# Patient Record
Sex: Female | Born: 1990 | Race: White | Hispanic: No | Marital: Single | Smoking: Never smoker
Health system: Southern US, Community
[De-identification: ages and names within clinical notes are randomized; demographics above are authoritative.]

## PROBLEM LIST (undated history)

## (undated) DIAGNOSIS — Z789 Other specified health status: Secondary | ICD-10-CM

## (undated) DIAGNOSIS — N760 Acute vaginitis: Secondary | ICD-10-CM

## (undated) DIAGNOSIS — B9689 Other specified bacterial agents as the cause of diseases classified elsewhere: Secondary | ICD-10-CM

## (undated) HISTORY — PX: NO PAST SURGERIES: SHX2092

## (undated) HISTORY — DX: Other specified health status: Z78.9

## (undated) HISTORY — PX: WRIST SURGERY: SHX841

## (undated) HISTORY — DX: Other specified bacterial agents as the cause of diseases classified elsewhere: N76.0

## (undated) HISTORY — DX: Acute vaginitis: B96.89

---

## 2013-10-23 ENCOUNTER — Emergency Department: Payer: Self-pay | Admitting: Emergency Medicine

## 2013-10-23 LAB — COMPREHENSIVE METABOLIC PANEL
Albumin: 4.1 g/dL (ref 3.4–5.0)
Alkaline Phosphatase: 80 U/L (ref 50–136)
BUN: 14 mg/dL (ref 7–18)
Bilirubin,Total: 0.7 mg/dL (ref 0.2–1.0)
Chloride: 105 mmol/L (ref 98–107)
Co2: 27 mmol/L (ref 21–32)
EGFR (African American): >60
EGFR (Non-African Amer.): >60
Glucose: 85 mg/dL (ref 65–99)
Osmolality: 270 (ref 275–301)
SGOT(AST): 23 U/L (ref 15–37)
SGPT (ALT): 17 U/L (ref 12–78)
Total Protein: 7.9 g/dL (ref 6.4–8.2)

## 2013-10-23 LAB — URINALYSIS, COMPLETE
Bacteria: NONE SEEN
Blood: NEGATIVE
Leukocyte Esterase: NEGATIVE
Nitrite: NEGATIVE
Ph: 6 (ref 4.5–8.0)
Protein: NEGATIVE
RBC,UR: <1 /HPF (ref 0–5)
Specific Gravity: 1.017 (ref 1.003–1.030)
WBC UR: 1 /HPF (ref 0–5)

## 2013-10-23 LAB — CBC WITH DIFFERENTIAL/PLATELET
Basophil #: 0 10*3/uL (ref 0.0–0.1)
Basophil %: 0.6 %
Eosinophil %: 2.2 %
HCT: 40.6 % (ref 35.0–47.0)
HGB: 14 g/dL (ref 12.0–16.0)
Lymphocyte #: 1.9 10*3/uL (ref 1.0–3.6)
MCH: 30.2 pg (ref 26.0–34.0)
MCHC: 34.4 g/dL (ref 32.0–36.0)
MCV: 88 fL (ref 80–100)
Monocyte #: 0.6 x10 3/mm (ref 0.2–0.9)
Neutrophil %: 60.7 %
Platelet: 257 10*3/uL (ref 150–440)
RBC: 4.64 10*6/uL (ref 3.80–5.20)

## 2013-10-23 LAB — GC/CHLAMYDIA PROBE AMP

## 2013-10-23 LAB — HCG, QUANTITATIVE, PREGNANCY: Beta Hcg, Quant.: 67 m[IU]/mL — ABNORMAL HIGH

## 2013-10-23 LAB — WET PREP, GENITAL

## 2014-05-16 ENCOUNTER — Inpatient Hospital Stay: Payer: Self-pay | Admitting: Obstetrics and Gynecology

## 2014-05-16 LAB — CBC WITH DIFFERENTIAL/PLATELET
Basophil #: 0 10*3/uL (ref 0.0–0.1)
Basophil %: 0.2 %
EOS ABS: 0.1 10*3/uL (ref 0.0–0.7)
Eosinophil %: 0.7 %
HCT: 38.7 % (ref 35.0–47.0)
HGB: 13.1 g/dL (ref 12.0–16.0)
Lymphocyte #: 1.2 10*3/uL (ref 1.0–3.6)
Lymphocyte %: 9.3 %
MCH: 31.8 pg (ref 26.0–34.0)
MCHC: 33.8 g/dL (ref 32.0–36.0)
MCV: 94 fL (ref 80–100)
MONO ABS: 0.9 x10 3/mm (ref 0.2–0.9)
Monocyte %: 6.8 %
NEUTROS ABS: 11.2 10*3/uL — AB (ref 1.4–6.5)
NEUTROS PCT: 83 %
Platelet: 170 10*3/uL (ref 150–440)
RBC: 4.11 10*6/uL (ref 3.80–5.20)
RDW: 13.2 % (ref 11.5–14.5)
WBC: 13.5 10*3/uL — ABNORMAL HIGH (ref 3.6–11.0)

## 2014-05-16 LAB — GC/CHLAMYDIA PROBE AMP

## 2014-05-17 LAB — HEMATOCRIT: HCT: 37.6 % (ref 35.0–47.0)

## 2014-05-19 LAB — BETA STREP CULTURE(ARMC)

## 2015-03-29 NOTE — Op Note (Signed)
PATIENT NAME:  Vanessa Vang, Vanessa Vang MR#:  [id number] DATE OF BIRTH:  Nov 13, 1991  DATE OF PROCEDURE:  05/16/2014  PREOPERATIVE DIAGNOSES:  1.  Gravida 2, P0-0-1-0 at 33 weeks, 5 days gestation.  2.  Preterm labor.  3.  Homero Fellers breech position.   POSTOPERATIVE DIAGNOSES: 1.  Gravida 2, P0-0-1-0 at 33 weeks, 5 days gestation.  2.  Preterm labor.  3.  Homero Fellers breech position.   OPERATION PERFORMED: Low transverse cesarean section with need for a T-shaped extension for delivery of the head secondary to head entrapment.   ANESTHESIA USED: General.   PRIMARY SURGEON: Vena Austria, MD   ASSISTANT: Surgical Tech, [address]   ESTIMATED BLOOD LOSS: 750 mL.  OPERATIVE FLUIDS: 1 L of crystalloid.   PREOPERATIVE ANTIBIOTICS: 1 gram of Ancef.   DRAINS OR TUBES: Foley to gravity drainage and On-Q catheter system.   COMPLICATIONS: Need for an extension of the hysterotomy incision into the contractile portion of the uterus for delivery of the fetal head.  FINDINGS: Normal tubes, ovaries, and uterus. Delivery resulted in the birth of a liveborn female infant weighing 2065 grams, Apgars 8 and 8. The patient initially presented to the office on day of delivery reporting intermittent cramping. She was checked in the office and noted to be 4 cm dilated. On presentation to labor and delivery, the patient was still 4 cm. She received 1 dose of betamethasone and had ampicillin started. Bedside ultrasound revealed the fetus to be in the frank breech position. The patient unfortunately progressed in her labor and was noted to make change to 6 cm and repeat ultrasound confirmed breech presentation, at which time we discussed the risks of vaginal delivery with preterm breech and the decision was made to proceed with C-section for delivery of the fetus.   SPECIMENS REMOVED: None.   THE PATIENT CONDITION FOLLOWING PROCEDURE: Stable.   PROCEDURE IN DETAIL: The patient was taken back to the operating room after  discussing the risks, benefits, and alternatives of the procedure. She was positioned in the supine position after administration of spinal anesthesia, prepped and draped in the usual sterile fashion. A timeout procedure was performed. The level of anesthetic was checked and noted to be adequate prior to proceeding with the case. A Pfannenstiel skin incision was made 2 cm above the pubic symphysis, carried down sharply to the level of the rectus fascia. The fascia was incised in the midline using the scalpel and then extended using Mayo scissors. The superior border of the rectus fascia was grasped with two Kocher clamps. The underlying rectus muscles were dissected off the fascia. The median raphe was incised using Mayo scissors. The inferior border of the rectus fascia was dissected off the muscles in a similar fashion. The midline was identified. The peritoneum was entered bluntly and the peritoneal incision was extended using manual traction. A bladder blade was placed. A low transverse incision was scored on the uterus. The uterus was entered bluntly using the operator's finger. The hysterotomy incision was then extended using manual traction.   Upon placing the operator's hand into the hysterotomy incision, the fetus was noted to be in the frank breech position as has been noted on ultrasound. The buttock was grasped and brought to the incision and the fetus was delivered to the level of the scapula. The left hand was splinted across the chest and delivered atraumatically. The fetus was rotated 180 degrees and the right hand was splinted and delivered in a similar fashion. The fetal  head was attempted to be delivered using a mauriceau smellie veit  maneuver; however, it was noted that the head appeared to be too large for the hysterotomy incision. There was no room to extend the hysterotomy incision laterally and it was deemed that any further attempts at delivering the fetus through the current hysterotomy  incision would place undue traction on the fetal neck. The decision was made to incise the hysterotomy incision with a T-shaped incision superiorly, which did extend into the contractile portion of the myometrium. Following this T-shaped extension, the fetal head was delivered without difficulty. Cord was clamped and cut and the infant was passed to the awaiting pediatricians. Cord blood was obtained. The placenta was delivered using manual extraction. The uterus was exteriorized and wiped clean of clots and debris. The hysterotomy incision was repaired using a 2 layer closure for the low transverse incision with the first being a running locked, the second a vertical imbricating. The T-shaped extension, which was approximately 3 cm, was repaired in 3 layers with the first being a running locked of 0 Vicryl and the second being a horizontal imbricating of 0 Vicryl. The uterine serosa was then closed over the T-shaped incision using a 2-0 Vicryl baseball stitch.   Following closure of the hysterotomy incision, the hysterotomy incision was reinspected and noted to be hemostatic. The uterus was returned to the abdomen. The peritoneal gutters were wiped clean of clots and debris using 2 moist laps. The rectus muscles were reapproximated in the midline using a 2-0 Vicryl mattress stitch. The On-Q catheters were then placed subfascially per the usual protocol. The rectus muscles were reinspected and noted to be hemostatic prior to closing the fascia, which was done using a looped #1 PDS in a running fashion. Subcutaneous tissue was irrigated. Hemostasis was achieved using Bovie. The skin was closed using a 4-0 Monocryl in a subcuticular fashion. Sponge, needle, and instrument counts were correct x 2. The patient's On-Q catheters were bolused with 5 mL of 0.5% bupivacaine each prior to leaving the operating room.  ____________________________ Florina OuAndreas M. Bonney AidStaebler, MD ams:aw D: 05/17/2014 09:03:43 ET T: 05/17/2014  09:24:04 ET JOB#: [id number]  cc: Florina OuAndreas M. Bonney AidStaebler, MD, <Dictator> Carmel SacramentoANDREAS Cathrine MusterM Muhammadali Ries MD ELECTRONICALLY SIGNED 05/28/2014 9:17

## 2015-04-15 NOTE — H&P (Signed)
L&D Evaluation:  History:  HPI 24 yo G2P0010 at 4537w5d by D=7wk US derived EDC of 06/29/2014 presenting for intermitten contractions since yesterday evening, starting after intercourse and increasing throughout day.  Seen in clinic noted to be 5/90/-2.  No LOF, no vaginal bleeding, +FM.   Presents with abdominal pain, preterm cervical dilation   Patient's Medical History No Chronic Illness   Patient's Surgical History none   Medications Pre Natal Vitamins   Allergies NKDA   Social History none   Family History Non-Contributory   ROS:  ROS All systems were reviewed.  HEENT, CNS, GI, GU, Respiratory, CV, Renal and Musculoskeletal systems were found to be normal.   Exam:  Vital Signs stable   Urine Protein not completed   General no apparent distress   Mental Status clear   Chest clear   Heart normal sinus rhythm   Abdomen gravid, non-tender   Estimated Fetal Weight Average for gestational age   Fetal Position Breech on bedside ultrasound   Back no CVAT   Edema no edema   Pelvic no external lesions, 4/C/-2   Mebranes Intact   FHT normal rate with no decels   FHT Description 140, moderate, positive accels, occasional variable   Ucx irregular, 5-3010min   Impression:  Impression Preterm labor 9237w5d   Plan:  Plan EFM/NST, monitor contractions and for cervical change, antibiotics for GBBS prophylaxis   Comments 1) Preterm labor / preterm cervical dilation - discussed with patient if cervical change and given breech position would proceed with LTCS for delivery given risk of head entrapment and untested pelvis.       - BMZ     - ampicillin     - GBS culture     - monitor contraction and cervical change     - NPO      - >32 weeks no magnesium sulfate for CP ppx indicated  2) Fetus - category I tracing  3) PNL O pos / ABSC neg / VZNI / RI / 1-hr 102 / GBS unknown  4) TDAP received 05/13/14   Electronic Signatures: Lorrene ReidStaebler, Katlyn Muldrew M (MD)  (Signed  11-Jun-15 20:21)  Authored: L&D Evaluation   Last Updated: 11-Jun-15 20:21 by Lorrene ReidStaebler, Riyanshi Wahab M (MD)

## 2017-06-13 ENCOUNTER — Emergency Department
Admission: EM | Admit: 2017-06-13 | Discharge: 2017-06-13 | Discharge status: Against Medical Advice | Payer: Managed Care, Other (non HMO) | Attending: Emergency Medicine | Admitting: Emergency Medicine

## 2017-06-13 ENCOUNTER — Encounter: Payer: Self-pay | Admitting: Emergency Medicine

## 2017-06-13 DIAGNOSIS — R1111 Vomiting without nausea: Secondary | ICD-10-CM | POA: Diagnosis not present

## 2017-06-13 DIAGNOSIS — R1030 Lower abdominal pain, unspecified: Secondary | ICD-10-CM | POA: Diagnosis present

## 2017-06-13 DIAGNOSIS — R102 Pelvic and perineal pain: Secondary | ICD-10-CM | POA: Insufficient documentation

## 2017-06-13 LAB — URINALYSIS, COMPLETE (UACMP) WITH MICROSCOPIC
Bacteria, UA: NONE SEEN
Bilirubin Urine: NEGATIVE
Glucose, UA: NEGATIVE mg/dL
Hgb urine dipstick: NEGATIVE
Ketones, ur: NEGATIVE mg/dL
Leukocytes, UA: NEGATIVE
Nitrite: NEGATIVE
Protein, ur: NEGATIVE mg/dL
Specific Gravity, Urine: 1.012 (ref 1.005–1.030)
pH: 6 (ref 5.0–8.0)

## 2017-06-13 LAB — COMPREHENSIVE METABOLIC PANEL
ALBUMIN: 4.4 g/dL (ref 3.5–5.0)
ALT: 13 U/L — AB (ref 14–54)
AST: 22 U/L (ref 15–41)
Alkaline Phosphatase: 58 U/L (ref 38–126)
Anion gap: 8 (ref 5–15)
BUN: 8 mg/dL (ref 6–20)
CHLORIDE: 103 mmol/L (ref 101–111)
CO2: 26 mmol/L (ref 22–32)
CREATININE: 0.84 mg/dL (ref 0.44–1.00)
Calcium: 9.7 mg/dL (ref 8.9–10.3)
GFR calc non Af Amer: >60 mL/min (ref >60)
GLUCOSE: 116 mg/dL — AB (ref 65–99)
Potassium: 3.8 mmol/L (ref 3.5–5.1)
SODIUM: 137 mmol/L (ref 135–145)
Total Bilirubin: 0.9 mg/dL (ref 0.3–1.2)
Total Protein: 7.7 g/dL (ref 6.5–8.1)

## 2017-06-13 LAB — CBC
HCT: 44.4 % (ref 35.0–47.0)
Hemoglobin: 15.4 g/dL (ref 12.0–16.0)
MCH: 31.6 pg (ref 26.0–34.0)
MCHC: 34.8 g/dL (ref 32.0–36.0)
MCV: 91 fL (ref 80.0–100.0)
Platelets: 223 10*3/uL (ref 150–440)
RBC: 4.88 MIL/uL (ref 3.80–5.20)
RDW: 12.7 % (ref 11.5–14.5)
WBC: 7.7 10*3/uL (ref 3.6–11.0)

## 2017-06-13 LAB — POCT PREGNANCY, URINE: PREG TEST UR: NEGATIVE

## 2017-06-13 LAB — LIPASE, BLOOD: Lipase: 29 U/L (ref 11–51)

## 2017-06-13 MED ORDER — NAPROXEN 500 MG PO TABS
500.0000 mg | ORAL_TABLET | Freq: Once | ORAL | Status: AC
  Administered 2017-06-13: 500 mg via ORAL
  Filled 2017-06-13 (×2): qty 1

## 2017-06-13 MED ORDER — NAPROXEN 500 MG PO TABS: 500.0000 mg | ORAL_TABLET | Freq: Two times a day (BID) | ORAL | 0 refills | Status: DC

## 2017-06-13 MED ORDER — KETOROLAC TROMETHAMINE 60 MG/2ML IM SOLN: 15.0000 mg | Freq: Once | INTRAMUSCULAR | Status: DC

## 2017-06-13 NOTE — ED Triage Notes (Signed)
Pt lower abdominal pain starting today.  Denies NVD, no complaints other than pain. No urinary sx.  No vaginal discharge.  ambulatory to triage.  NAD. VSS

## 2017-06-13 NOTE — ED Provider Notes (Signed)
[facility] Emergency Department Provider Note  ____________________________________________  Time seen: Approximately 8:11 PM  I have reviewed the triage vital signs and the nursing notes.   HISTORY  Chief Complaint Abdominal Pain    HPI Vanessa Vang is a 26 y.o. female who complains of lower abdominal pain with vomiting that started this morning, she initially felt it on the right lower quadrant but now that has migrated to the left lower quadrant. The pain is colicky with sharp stabs that lasts for just a second, but then reoccurred about every 3-5 minutes. Nonradiating. No aggravating or alleviating factors. Severe when present. She first went to urgent care who sent her to the ED because she was having severe pain and doubled over. Here she reports that it is improved but still pretty substantial. Last period was about 3 weeks ago, typical timing for her. No history of ovarian cysts.     History reviewed. No pertinent past medical history.   There are no active problems to display for this patient.    History reviewed. No pertinent surgical history.   Prior to Admission medications   Medication Sig Start Date End Date Taking? Authorizing Provider  naproxen (NAPROSYN) 500 MG tablet Take 1 tablet (500 mg total) by mouth 2 (two) times daily with a meal. 06/13/17   Sharman Cheek, MD     Allergies Patient has no known allergies.   History reviewed. No pertinent family history.  Social History Social History  Substance Use Topics  . Smoking status: Never Smoker  . Smokeless tobacco: Never Used  . Alcohol use Yes    Review of Systems  Constitutional:   No fever or chills.  ENT:   No sore throat. No rhinorrhea. Cardiovascular:   No chest pain or syncope. Respiratory:   No dyspnea or cough. Gastrointestinal:   Abdominal pain as above with vomiting. No diarrhea or constipation. No dysuria frequency urgency . No vaginal bleeding or  discharge Musculoskeletal:   Negative for focal pain or swelling All other systems reviewed and are negative except as documented above in ROS and HPI.  ____________________________________________   PHYSICAL EXAM:  VITAL SIGNS: ED Triage Vitals  Enc Vitals Group     BP 06/13/17 1623 126/76     Pulse Rate 06/13/17 1623 88     Resp 06/13/17 1623 16     Temp 06/13/17 1623 98.5 F (36.9 C)     Temp Source 06/13/17 1623 Oral     SpO2 06/13/17 1623 100 %     Weight 06/13/17 1624 144 lb (65.3 kg)     Height 06/13/17 1624 5\' 4"  (1.626 m)     Head Circumference --      Peak Flow --      Pain Score 06/13/17 1623 6     Pain Loc --      Pain Edu? --      Excl. in GC? --     Vital signs reviewed, nursing assessments reviewed.   Constitutional:   Alert and oriented. Well appearing and in no distress. Eyes:   No scleral icterus.  EOMI. No nystagmus. No conjunctival pallor. PERRL. ENT   Head:   Normocephalic and atraumatic.   Nose:   No congestion/rhinnorhea.    Mouth/Throat:   MMM, no pharyngeal erythema. No peritonsillar mass.    Neck:   No meningismus. Full ROM Hematological/Lymphatic/Immunilogical:   No cervical lymphadenopathy. Cardiovascular:   RRR. Symmetric bilateral radial and DP pulses.  No murmurs.  Respiratory:   Normal respiratory effort without tachypnea/retractions. Breath sounds are clear and equal bilaterally. No wheezes/rales/rhonchi. Gastrointestinal:   Soft with focal tenderness in the left lower quadrant. Non distended. There is no CVA tenderness.  No rebound, rigidity, or guarding. Genitourinary:   deferred Musculoskeletal:   Normal range of motion in all extremities. No joint effusions.  No lower extremity tenderness.  No edema. Neurologic:   Normal speech and language.  Motor grossly intact. No gross focal neurologic deficits are appreciated.  Skin:    Skin is warm, dry and intact. No rash noted.  No petechiae, purpura, or  bullae.  ____________________________________________    LABS (pertinent positives/negatives) (all labs ordered are listed, but only abnormal results are displayed) Labs Reviewed  COMPREHENSIVE METABOLIC PANEL - Abnormal; Notable for the following:       Result Value   Glucose, Bld 116 (*)    ALT 13 (*)    All other components within normal limits  URINALYSIS, COMPLETE (UACMP) WITH MICROSCOPIC - Abnormal; Notable for the following:    Color, Urine YELLOW (*)    APPearance CLEAR (*)    Squamous Epithelial / LPF 0-5 (*)    All other components within normal limits  URINE CULTURE  LIPASE, BLOOD  CBC  POC URINE PREG, ED  POCT PREGNANCY, URINE   ____________________________________________   EKG    ____________________________________________    RADIOLOGY  No results found.  ____________________________________________   PROCEDURES Procedures  ____________________________________________   INITIAL IMPRESSION / ASSESSMENT AND PLAN / ED COURSE  Pertinent labs & imaging results that were available during my care of the patient were reviewed by me and considered in my medical decision making (see chart for details).  Patient presents with left lower quadrant pain colicky. Vital signs unremarkable. Not in severe cramping pain, but uncomfortable with localizing tenderness. Based on her symptoms, low suspicion for STI PID TOA. She is not pregnant. Low suspicion for appendicitis perforation or obstruction. No evidence of UTI. I have low suspicion for torsion, but that is the most likely possibility other than something benign by constipation or gas pain, so I recommended a ultrasound of the abdomen and pelvis. Patient was initially agreeable, but now at 8:00 PM reports that she has to leave so that she can become per son by 8:30. I discussed with her the concerns again about torsion and the risks of loss of ovary and decreased fertility or hormonal disruption if this were to  occur. She acknowledges this, has medical decision-making capacity and isn't completely normal mental status, which is has to go due to family obligations. Recommended that if her symptoms do not improve or resolve that she should return to this emergency Department as soon as possible for further assessment. We'll sign her out AGAINST MEDICAL ADVICE at this point      ____________________________________________   FINAL CLINICAL IMPRESSION(S) / ED DIAGNOSES  Final diagnoses:  Pelvic pain in female      New Prescriptions   NAPROXEN (NAPROSYN) 500 MG TABLET    Take 1 tablet (500 mg total) by mouth 2 (two) times daily with a meal.     Portions of this note were generated with dragon dictation software. Dictation errors may occur despite best attempts at proofreading.    Sharman CheekStafford, Obi Scrima, MD 06/13/17 2015

## 2017-06-13 NOTE — ED Notes (Signed)
Pt states lower abd pain and vomiting this AM, states since symptoms have resolved, denies any vaginal discharge or bleeding, awake and alert in no acute distress

## 2017-06-15 LAB — URINE CULTURE

## 2018-06-27 ENCOUNTER — Encounter: Payer: Self-pay | Admitting: Emergency Medicine

## 2018-06-27 ENCOUNTER — Emergency Department: Payer: Self-pay

## 2018-06-27 ENCOUNTER — Emergency Department
Admission: EM | Admit: 2018-06-27 | Discharge: 2018-06-27 | Disposition: A | Discharge status: Home / Self Care | Payer: Self-pay | Attending: Emergency Medicine | Admitting: Emergency Medicine

## 2018-06-27 ENCOUNTER — Other Ambulatory Visit: Payer: Self-pay

## 2018-06-27 DIAGNOSIS — Z3A08 8 weeks gestation of pregnancy: Secondary | ICD-10-CM | POA: Insufficient documentation

## 2018-06-27 DIAGNOSIS — O4690 Antepartum hemorrhage, unspecified, unspecified trimester: Secondary | ICD-10-CM

## 2018-06-27 DIAGNOSIS — O200 Threatened abortion: Secondary | ICD-10-CM | POA: Insufficient documentation

## 2018-06-27 LAB — CBC
HCT: 43 % (ref 35.0–47.0)
HEMOGLOBIN: 15.1 g/dL (ref 12.0–16.0)
MCH: 32.6 pg (ref 26.0–34.0)
MCHC: 35.1 g/dL (ref 32.0–36.0)
MCV: 92.9 fL (ref 80.0–100.0)
Platelets: 224 10*3/uL (ref 150–440)
RBC: 4.62 MIL/uL (ref 3.80–5.20)
RDW: 13.2 % (ref 11.5–14.5)
WBC: 10.1 10*3/uL (ref 3.6–11.0)

## 2018-06-27 LAB — POCT PREGNANCY, URINE: Preg Test, Ur: POSITIVE — AB

## 2018-06-27 LAB — ABO/RH: ABO/RH(D): O POS

## 2018-06-27 LAB — HCG, QUANTITATIVE, PREGNANCY: hCG, Beta Chain, Quant, S: 14431 m[IU]/mL — ABNORMAL HIGH (ref <5)

## 2018-06-27 MED ORDER — PRENATAL VITAMINS 0.8 MG PO TABS: 1.0000 | ORAL_TABLET | Freq: Every day | ORAL | 3 refills | Status: DC

## 2018-06-27 NOTE — Discharge Instructions (Signed)
Your ultrasound shows your pregnancy is in the normal location inside the uterus, estimated gestational age of [redacted] weeks 5 days.  Your pregnancy hormone (beta hCG) level is 14,400 today.  Follow-up with obstetrics in 2 to 3 days for repeat assessment of your symptoms.

## 2018-06-27 NOTE — ED Triage Notes (Signed)
Patient to ER for c/o vaginal bleeding since yesterday. Patient states she is [redacted] weeks pregnant (based on LMP of 05/06/18). Patient states she started having cramping 3 days ago, started spotting yesterday, has heavier bright red bleeding today. This is patient's 3rd pregnancy, has 1 living child, sees [facility] (has not seen this pregnancy yet).

## 2018-06-27 NOTE — ED Provider Notes (Signed)
[facility] Emergency Department Provider Note  ____________________________________________  Time seen: Approximately 8:49 PM  I have reviewed the triage vital signs and the nursing notes.   HISTORY  Chief Complaint Vaginal Bleeding    HPI Vanessa Vang is a 27 y.o. female who complains of cramping pelvic pain that started 3 days ago, gradual onset, mild to moderate intensity, intermittent, waxing waning, no aggravating or alleviating factors, associated with vaginal spotting that started yesterday and gradually worsened to today.  Denies chest pain shortness of breath fevers chills lightheadedness.   Has not noticed any tissue passage.  No prenatal care yet during this pregnancy.     Past Medical History:  Diagnosis Date  . No known health problems      There are no active problems to display for this patient.    Past Surgical History:  Procedure Laterality Date  . CESAREAN SECTION  2015     Prior to Admission medications   Not on File     Allergies Patient has no known allergies.   Family History  Problem Relation Age of Onset  . Other Father        accident    Social History Social History   Tobacco Use  . Smoking status: Never Smoker  . Smokeless tobacco: Never Used  Substance Use Topics  . Alcohol use: Yes    Comment: occ  . Drug use: No    Review of Systems  Constitutional:   No fever or chills.  Cardiovascular:   No chest pain or syncope. Respiratory:   No dyspnea or cough. Gastrointestinal:   Positive pelvic pain as above.  No vomiting diarrhea or constipation.   Musculoskeletal:   Negative for focal pain or swelling All other systems reviewed and are negative except as documented above in ROS and HPI.  ____________________________________________   PHYSICAL EXAM:  VITAL SIGNS: ED Triage Vitals  Enc Vitals Group     BP 06/27/18 1914 132/72     Pulse Rate 06/27/18 1914 73     Resp 06/27/18 1914 20      Temp 06/27/18 1914 98.7 F (37.1 C)     Temp Source 06/27/18 1914 Oral     SpO2 06/27/18 1914 100 %     Weight 06/27/18 1916 148 lb (67.1 kg)     Height 06/27/18 1916 5\' 4"  (1.626 m)     Head Circumference --      Peak Flow --      Pain Score 06/27/18 1916 6     Pain Loc --      Pain Edu? --      Excl. in GC? --     Vital signs reviewed, nursing assessments reviewed.   Constitutional:   Alert and oriented. Non-toxic appearance. Eyes:   Conjunctivae are normal. EOMI. ENT      Head:   Normocephalic and atraumatic.      Nose:   No congestion/rhinnorhea.       Mouth/Throat:   MMM, no pharyngeal erythema. No peritonsillar mass.       Neck:   No meningismus. Full ROM. Hematological/Lymphatic/Immunilogical:   No cervical lymphadenopathy. Cardiovascular:   RRR. Symmetric bilateral radial and DP pulses.  No murmurs. Cap refill less than 2 seconds. Respiratory:   Normal respiratory effort without tachypnea/retractions. Breath sounds are clear and equal bilaterally. No wheezes/rales/rhonchi. Gastrointestinal:   Soft with mild suprapubic tenderness. Non distended. There is no CVA tenderness.  No rebound, rigidity, or guarding. Musculoskeletal:  Normal range of motion in all extremities. No joint effusions.  No lower extremity tenderness.  No edema. Neurologic:   Normal speech and language.  Motor grossly intact. No acute focal neurologic deficits are appreciated.    ____________________________________________    LABS (pertinent positives/negatives) (all labs ordered are listed, but only abnormal results are displayed) Labs Reviewed  HCG, QUANTITATIVE, PREGNANCY - Abnormal; Notable for the following components:      Result Value   hCG, Beta Chain, Quant, S 14,431 (*)    All other components within normal limits  POCT PREGNANCY, URINE - Abnormal; Notable for the following components:   Preg Test, Ur POSITIVE (*)    All other components within normal limits  CBC  POC URINE PREG,  ED  ABO/RH   ____________________________________________   EKG    ____________________________________________    RADIOLOGY  No results found.  ____________________________________________   PROCEDURES Procedures  ____________________________________________  DIFFERENTIAL DIAGNOSIS   Ectopic pregnancy, threatened or completed miscarriage  CLINICAL IMPRESSION / ASSESSMENT AND PLAN / ED COURSE  Pertinent labs & imaging results that were available during my care of the patient were reviewed by me and considered in my medical decision making (see chart for details).    Patient approximately [redacted] weeks pregnant, presents with vaginal bleeding and pelvic cramping.  Possible miscarriage versus ectopic.  Doubt torsion or infectious process.  Ultrasound pelvis ordered.  Clinical Course as of Jul 03 22  Tue Jun 27, 2018  2049 Rh+.  Beta hCG above discriminatory zone .  hCG on the low side for stated LMP of June 1 that would give a gestational age of almost 8 weeks.   [PS]    Clinical Course User Index [PS] Sharman Cheek, MD    ----------------------------------------- 12:22 AM on 07/03/2018 -----------------------------------------  Late entry note, ultrasound showed intrauterine gestational sac.  Counseled on possible ectopic and need for close follow-up within 2 to 3 days for recheck of her beta quant.  Threatened miscarriage.  Discharged home to follow-up with gynecology.  ____________________________________________   FINAL CLINICAL IMPRESSION(S) / ED DIAGNOSES    Final diagnoses:  Vaginal bleeding in pregnancy  Threatened miscarriage     ED Discharge Orders        Ordered    Prenatal Multivit-Min-Fe-FA (PRENATAL VITAMINS) 0.8 MG tablet  Daily,   Status:  Discontinued     06/27/18 2200      Portions of this note were generated with dragon dictation software. Dictation errors may occur despite best attempts at proofreading.    Sharman Cheek,  MD 07/03/18 [phone]

## 2018-06-30 ENCOUNTER — Other Ambulatory Visit
Admission: RE | Admit: 2018-06-30 | Discharge: 2018-06-30 | Disposition: A | Discharge status: Home / Self Care | Payer: Self-pay | Source: Ambulatory Visit | Attending: Obstetrics and Gynecology | Admitting: Obstetrics and Gynecology

## 2018-06-30 ENCOUNTER — Encounter: Payer: Self-pay | Admitting: Obstetrics and Gynecology

## 2018-06-30 ENCOUNTER — Ambulatory Visit (INDEPENDENT_AMBULATORY_CARE_PROVIDER_SITE_OTHER): Payer: Self-pay | Admitting: Obstetrics and Gynecology

## 2018-06-30 VITALS — BP 102/60 | HR 74 | Ht 64.0 in | Wt 150.0 lb

## 2018-06-30 DIAGNOSIS — Z3A00 Weeks of gestation of pregnancy not specified: Secondary | ICD-10-CM | POA: Insufficient documentation

## 2018-06-30 DIAGNOSIS — O200 Threatened abortion: Secondary | ICD-10-CM

## 2018-06-30 LAB — HCG, QUANTITATIVE, PREGNANCY: HCG, BETA CHAIN, QUANT, S: 21031 m[IU]/mL — AB (ref <5)

## 2018-06-30 NOTE — Progress Notes (Signed)
Obstetrics & Gynecology Office Visit   Chief Complaint  Patient presents with  . Vaginal Bleeding    x4 days   History of Present Illness: 27 y.o. [id number] female who presents in follow up from an ER visit on 06/27/18. She presented with vaginal spotting. She had a positive pregnancy test and beta HCG of 14,431.  Her blood type is O+.  Ultrasound showed intrauterine gestational sac without yolk sac or fetal pole.  MSD was 9.7 mm, corresponding an approximate gestational age of 4667w5d.  She states that has bleeding and cramping a lot. She has been passing little blood clots ("size of a bottle cap").  The cramping a week ago. She started bleeding 4 days ago. It started as spotting, then the bleeding became heavier. The bleeding was off and on throughout the day.  She states that the ER could not tell her whether she was having a miscarriage because it was too early on.  Her bleeding has been about the same since she has been in the ER.  The bleeding does become heavier throughout the day.  She has a history of 2 elective abortions and one living child delivered by c-section in 2015.  She states she had an emergency c-section due to bleeding at 7 months.  She delivered at [facility]. According to the operative report, she was 6052w5d and was in preterm labor and the fetus was breech.  Her LMP was 05/06/18.  She states that she has difficulty sleeping.   Past Medical History:  Diagnosis Date  . No known health problems     Past Surgical History:  Procedure Laterality Date  . CESAREAN SECTION  2015    Gynecologic History: Patient's last menstrual period was 05/06/2018 (exact date).  Obstetric History: [id number]  Family History  Problem Relation Age of Onset  . Other Father        accident  Denies family history of gyn cancer  Social History   Socioeconomic History  . Marital status: Single    Spouse name: Not on file  . Number of children: Not on file  . Years of education: Not on file   . Highest education level: Not on file  Occupational History  . Not on file  Social Needs  . Financial resource strain: Not on file  . Food insecurity:    Worry: Not on file    Inability: Not on file  . Transportation needs:    Medical: Not on file    Non-medical: Not on file  Tobacco Use  . Smoking status: Never Smoker  . Smokeless tobacco: Never Used  Substance and Sexual Activity  . Alcohol use: Yes    Comment: occ  . Drug use: No  . Sexual activity: Yes  Lifestyle  . Physical activity:    Days per week: Not on file    Minutes per session: Not on file  . Stress: Not on file  Relationships  . Social connections:    Talks on phone: Not on file    Gets together: Not on file    Attends religious service: Not on file    Active member of club or organization: Not on file    Attends meetings of clubs or organizations: Not on file    Relationship status: Not on file  . Intimate partner violence:    Fear of current or ex partner: Not on file    Emotionally abused: Not on file    Physically abused: Not on file  Forced sexual activity: Not on file  Other Topics Concern  . Not on file  Social History Narrative  . Not on file   Allergies: No Known Allergies  Prior to Admission medications   Denies    Review of Systems  Constitutional: Negative.   HENT: Negative.   Eyes: Negative.   Respiratory: Negative.   Cardiovascular: Negative.   Gastrointestinal: Negative.   Genitourinary: Negative.        See HPI  Musculoskeletal: Negative.   Skin: Negative.   Neurological: Negative.   Psychiatric/Behavioral: Negative.      Physical Exam BP 102/60 (BP Location: Left Arm, Patient Position: Sitting, Cuff Size: Normal)   Pulse 74   Ht 5\' 4"  (1.626 m)   Wt 150 lb (68 kg)   LMP 05/06/2018 (Exact Date)   SpO2 99%   BMI 25.75 kg/m  Patient's last menstrual period was 05/06/2018 (exact date). Physical Exam  Constitutional: She is oriented to person, place, and time.  She appears well-developed and well-nourished. No distress.  Genitourinary: Vagina normal and uterus normal. Pelvic exam was performed with patient supine. There is no rash, tenderness or lesion on the right labia. There is no rash, tenderness or lesion on the left labia. Bleeding: scant old blood in vaginal vault. Right adnexum does not display mass, does not display tenderness and does not display fullness. Left adnexum does not display mass, does not display tenderness and does not display fullness. Cervix does not exhibit motion tenderness, lesion or polyp.   Uterus is mobile. Uterus is not enlarged, tender, exhibiting a mass or irregular (is regular).  Genitourinary Comments: Cervical os is closed. No active bleeding noted.  HENT:  Head: Normocephalic and atraumatic.  Eyes: Conjunctivae are normal. No scleral icterus.  Neck: Normal range of motion. Neck supple. No thyromegaly present.  Cardiovascular: Normal rate and regular rhythm. Exam reveals no gallop and no friction rub.  No murmur heard. Pulmonary/Chest: Effort normal and breath sounds normal. No respiratory distress. She has no wheezes. She has no rales.  Abdominal: Soft. Bowel sounds are normal. She exhibits no distension and no mass. There is tenderness (mild ttp epigastric and suprapubic areas). There is no rebound and no guarding.  Musculoskeletal: Normal range of motion. She exhibits no edema.  Neurological: She is alert and oriented to person, place, and time. No cranial nerve deficit.  Skin: Skin is warm and dry. No erythema.  Psychiatric: She has a normal mood and affect. Her behavior is normal. Judgment normal.   Female chaperone present for pelvic and breast  portions of the physical exam  Lab Results: Lab Results  Component Value Date   HCGBETAQNT 21,031 (H) 06/30/2018   HCGBETAQNT 14,431 (H) 06/27/2018     Assessment: 27 y.o. [id number] female here for  1. Threatened abortion      Plan: Problem List Items  Addressed This Visit    None    Visit Diagnoses    Threatened abortion    -  Primary   Relevant Orders   hCG, quantitative, pregnancy   Beta hCG quant (ref lab)   US OB Comp Less 14 Wks     Discussed threatened miscarriage. Discussed follow up plan of trending hCG levels. Also, discussed repeating ultrasound 2 weeks from prior ultrasound to assess progress, if hCG values are rising appropriately. I discussed potential management strategies for her, should this be a spontaneous abortion, including; expectant management, medication management with misoprostol, and surgical management with D&C.   I called her  once I received her HCG from today. Discussed that the level did not rise appropriately. However, will continue to trend the value until we can be sure.  Order placed for a future hCG. I also ordered a repeat ultrasound 14 days from last ultrasound based on ACOG PB 200 recommendations.  I informed her that I would not be here next week and that she will have to call to get her results and interpretation.  She voiced understanding and agreement.  30 minutes spent in face to face discussion with > 50% spent in counseling,management, and coordination of care of her threatened abortion.   Return for schedule lab draw for 7/30.  Otherwise, schedule pelvic ultrasound for 8/6 for viability.   Thomasene Mohair, MD 06/30/2018 12:49 PM

## 2018-07-06 ENCOUNTER — Other Ambulatory Visit: Payer: Self-pay

## 2018-07-06 DIAGNOSIS — O200 Threatened abortion: Secondary | ICD-10-CM

## 2018-07-07 ENCOUNTER — Telehealth: Payer: Self-pay

## 2018-07-07 LAB — BETA HCG QUANT (REF LAB): hCG Quant: 36228 m[IU]/mL

## 2018-07-07 NOTE — Telephone Encounter (Signed)
Pt called triage today, she had a Beta drawn yesterday and would like to know if she is actually having a miscarriage or not. Sense SDJ is not here will you please advise in his absence.

## 2018-07-07 NOTE — Treatment Plan (Signed)
Discussed beta HCG results with patient; rising value but not at the rate normally seen for pregnancy. Advised that she should come in for an ultrasound and visit; orders in system from last visit with Dr. Jean RosenthalJackson.  Marcelyn BruinsJacelyn Adriann Ballweg, CNM 07/07/2018  11:30 AM

## 2018-07-10 ENCOUNTER — Encounter: Payer: Self-pay | Admitting: Obstetrics and Gynecology

## 2018-07-14 ENCOUNTER — Ambulatory Visit (INDEPENDENT_AMBULATORY_CARE_PROVIDER_SITE_OTHER): Payer: Self-pay | Admitting: Obstetrics and Gynecology

## 2018-07-14 ENCOUNTER — Other Ambulatory Visit (HOSPITAL_COMMUNITY)
Admission: RE | Admit: 2018-07-14 | Discharge: 2018-07-14 | Disposition: A | Discharge status: Home / Self Care | Payer: Self-pay | Source: Ambulatory Visit | Attending: Obstetrics and Gynecology | Admitting: Obstetrics and Gynecology

## 2018-07-14 ENCOUNTER — Encounter: Payer: Self-pay | Admitting: Obstetrics and Gynecology

## 2018-07-14 VITALS — BP 120/80 | Wt 151.0 lb

## 2018-07-14 DIAGNOSIS — N760 Acute vaginitis: Secondary | ICD-10-CM

## 2018-07-14 DIAGNOSIS — N898 Other specified noninflammatory disorders of vagina: Secondary | ICD-10-CM

## 2018-07-14 DIAGNOSIS — B9689 Other specified bacterial agents as the cause of diseases classified elsewhere: Secondary | ICD-10-CM

## 2018-07-14 MED ORDER — METRONIDAZOLE 0.75 % VA GEL: 1.0000 | Freq: Every day | VAGINAL | 1 refills | Status: AC

## 2018-07-14 NOTE — Progress Notes (Signed)
   Patient ID: [name], female   DOB: 21-Jul-1991, 27 y.o.   MRN: [id number]  Reason fMeredeth Ideor Consult: Dysmenorrhea; Vaginal Bleeding; and Threatened Miscarriage (vaginal odor)   [facility]  Subjective:     HPI:  Vanessa Vang is a 27 y.o. female . She presents today for vaginal odor. She is currently being seen for threatened miscarriage by Dr. Jean RosenthalJackson. She has an US scheduled for next week on 07/18/18. She is following up then for viability. She denies any white or yellow vaginal discharge. Denies thin watery vaginal discharge. Reports mild crampy abdominal pain. She has had a small amount of continued vaginal bleeding that she reports is only present when she wipes.    Past Medical History:  Diagnosis Date  . No known health problems    Family History  Problem Relation Age of Onset  . Other Father        accident   Past Surgical History:  Procedure Laterality Date  . CESAREAN SECTION  2015    Short Social History:  Social History   Tobacco Use  . Smoking status: Never Smoker  . Smokeless tobacco: Never Used  Substance Use Topics  . Alcohol use: Yes    Comment: occ    No Known Allergies  No current outpatient medications on file.   No current facility-administered medications for this visit.     Review of Systems  Constitutional: Negative for chills, fatigue, fever and unexpected weight change.  HENT: Negative for trouble swallowing.  Eyes: Negative for loss of vision.  Respiratory: Negative for cough, shortness of breath and wheezing.  Cardiovascular: Negative for chest pain, leg swelling, palpitations and syncope.  GI: Negative for abdominal pain, blood in stool, diarrhea, nausea and vomiting.  GU: Negative for difficulty urinating, dysuria, frequency and hematuria.  Musculoskeletal: Negative for back pain, leg pain and joint pain.  Skin: Negative for rash.  Neurological: Negative for dizziness, headaches, light-headedness, numbness  and seizures.  Psychiatric: Negative for behavioral problem, confusion, depressed mood and sleep disturbance.        Objective:  Objective   Vitals:   07/14/18 1407  BP: 120/80  Weight: 151 lb (68.5 kg)   Body mass index is 25.92 kg/m.  Physical Exam  Constitutional: She is oriented to person, place, and time. She appears well-developed and well-nourished.  HENT:  Head: Normocephalic and atraumatic.  Eyes: Pupils are equal, round, and reactive to light. EOM are normal.  Cardiovascular: Normal rate and regular rhythm.  Pulmonary/Chest: Effort normal. No respiratory distress.  Abdominal: Soft.  Neurological: She is alert and oriented to person, place, and time.  Skin: Skin is warm and dry.  Psychiatric: She has a normal mood and affect. Her behavior is normal. Judgment and thought content normal.  Nursing note and vitals reviewed.  Wet Prep: Clue Cells: Positive Fungal elements: Negative Trichomonas: Negative      Assessment/Plan:     27 yo with vaginal odor 1. Bacterial Vaginosis- will treat with metrogel. Sent aptima to confirm.  2. Follow up ASAP for US confirmation of pregnancy.    Adelene Idlerhristanna Schuman MD [facility], [facility] 07/14/18 2:36 PM

## 2018-07-17 LAB — CERVICOVAGINAL ANCILLARY ONLY
Bacterial vaginitis: NEGATIVE
CANDIDA VAGINITIS: NEGATIVE
CHLAMYDIA, DNA PROBE: NEGATIVE
NEISSERIA GONORRHEA: NEGATIVE
TRICH (WINDOWPATH): NEGATIVE

## 2018-07-21 NOTE — Progress Notes (Signed)
Negative, Released to mychart 

## 2018-07-25 ENCOUNTER — Ambulatory Visit: Payer: Self-pay | Admitting: Obstetrics and Gynecology

## 2018-07-25 ENCOUNTER — Other Ambulatory Visit: Payer: Self-pay

## 2019-09-28 ENCOUNTER — Ambulatory Visit: Payer: Self-pay | Admitting: Obstetrics and Gynecology

## 2019-10-10 NOTE — Progress Notes (Deleted)
   PCP:  [facility]   No chief complaint on file.    HPI:      [name] is a 28 y.o. [phone] who LMP was No LMP recorded. Patient is pregnant., presents today for her annual examination.  Her menses are {norm/abn:715}, lasting {number:22536} days.  Dysmenorrhea {dysmen:716}. She {does:18564} have intermenstrual bleeding.  Sex activity: {sex active:315163}.  Last Pap: September 10, 2015  Results were: no abnormalities  Hx of STDs: none  There is no FH of breast cancer. There is no FH of ovarian cancer. The patient {does:18564} do self-breast exams.  Tobacco use: {tob:20664} Alcohol use: {Alcohol:11675} No drug use.  Exercise: {exercise:31265}  She {does:18564} get adequate calcium and Vitamin D in her diet.   There are no active problems to display for this patient.   Past Surgical History:  Procedure Laterality Date  . CESAREAN SECTION  2015    Family History  Problem Relation Age of Onset  . Other Father        accident    Social History   Socioeconomic History  . Marital status: Single    Spouse name: Not on file  . Number of children: Not on file  . Years of education: Not on file  . Highest education level: Not on file  Occupational History  . Not on file  Social Needs  . Financial resource strain: Not on file  . Food insecurity    Worry: Not on file    Inability: Not on file  . Transportation needs    Medical: Not on file    Non-medical: Not on file  Tobacco Use  . Smoking status: Never Smoker  . Smokeless tobacco: Never Used  Substance and Sexual Activity  . Alcohol use: Yes    Comment: occ  . Drug use: No  . Sexual activity: Yes  Lifestyle  . Physical activity    Days per week: Not on file    Minutes per session: Not on file  . Stress: Not on file  Relationships  . Social Herbalist on phone: Not on file    Gets together: Not on file    Attends religious service: Not on file    Active member of club or  organization: Not on file    Attends meetings of clubs or organizations: Not on file    Relationship status: Not on file  . Intimate partner violence    Fear of current or ex partner: Not on file    Emotionally abused: Not on file    Physically abused: Not on file    Forced sexual activity: Not on file  Other Topics Concern  . Not on file  Social History Narrative  . Not on file    No current outpatient medications on file.     ROS:  Review of Systems BREAST: No symptoms   Objective: There were no vitals taken for this visit.   OBGyn Exam  Results: No results found for this or any previous visit (from the past 24 hour(s)).  Assessment/Plan: No diagnosis found.  No orders of the defined types were placed in this encounter.            GYN counsel {counseling:16159}     F/U  No follow-ups on file.  Kennetha Pearman B. Mykeisha Dysert, PA-C 10/10/2019 4:22 PM

## 2019-10-11 ENCOUNTER — Ambulatory Visit: Payer: Self-pay | Admitting: Obstetrics and Gynecology

## 2019-12-05 ENCOUNTER — Ambulatory Visit (INDEPENDENT_AMBULATORY_CARE_PROVIDER_SITE_OTHER): Payer: BC Managed Care – PPO | Admitting: Obstetrics and Gynecology

## 2019-12-05 ENCOUNTER — Other Ambulatory Visit: Payer: Self-pay

## 2019-12-05 ENCOUNTER — Encounter: Payer: Self-pay | Admitting: Obstetrics and Gynecology

## 2019-12-05 VITALS — BP 110/60 | Ht 64.0 in | Wt 153.0 lb

## 2019-12-05 DIAGNOSIS — N898 Other specified noninflammatory disorders of vagina: Secondary | ICD-10-CM

## 2019-12-05 DIAGNOSIS — Z30011 Encounter for initial prescription of contraceptive pills: Secondary | ICD-10-CM

## 2019-12-05 MED ORDER — MICROGESTIN 24 FE 1-20 MG-MCG PO TABS
1.0000 | ORAL_TABLET | Freq: Every day | ORAL | 1 refills | Status: DC
Start: 2019-12-05 — End: 2020-01-29

## 2019-12-05 NOTE — Progress Notes (Addendum)
[facility], [place]   Chief Complaint  Patient presents with  . Contraception    interested in pills    HPI:      [name] is a 28 y.o. [id number] who LMP was Patient's last menstrual period was 11/30/2019., presents today for [facility] consult. Would like OCPs. Did depo in past with wt gain. Never been on OCPs. No hx of HTN, DVTs, migraines with aura. Menses are monthly, lasting 4-5 day, no BTB unless takes Plan B, no dysmen. She is sex active, using condoms.   Has noticed "dead" smell vaginally before and after menses, lasting several days before resolution, for many months. Nop vaginal itch/irritation. Had neg eval with PCP, including neg STD testing, about 6 months ago. Hx of BV in past. No sx today. Has tried repHresh with some improvement.   Annual past due.  Past Medical History:  Diagnosis Date  . No known health problems      Past Surgical History:  Procedure Laterality Date  . CESAREAN SECTION  2015  . NO PAST SURGERIES      Family History  Problem Relation Age of Onset  . Other Father        accident    Social History   Socioeconomic History  . Marital status: Single    Spouse name: Not on file  . Number of children: Not on file  . Years of education: Not on file  . Highest education level: Not on file  Occupational History  . Not on file  Tobacco Use  . Smoking status: Never Smoker  . Smokeless tobacco: Never Used  Substance and Sexual Activity  . Alcohol use: Yes    Comment: occ  . Drug use: No  . Sexual activity: Yes    Birth control/protection: None  Other Topics Concern  . Not on file  Social History Narrative  . Not on file   Social Determinants of Health   Financial Resource Strain:   . Difficulty of Paying Living Expenses: Not on file  Food Insecurity:   . Worried About Programme researcher, broadcasting/film/video in the Last Year: Not on file  . Ran Out of Food in the Last Year: Not on file  Transportation Needs:   . Lack of Transportation  (Medical): Not on file  . Lack of Transportation (Non-Medical): Not on file  Physical Activity:   . Days of Exercise per Week: Not on file  . Minutes of Exercise per Session: Not on file  Stress:   . Feeling of Stress : Not on file  Social Connections:   . Frequency of Communication with Friends and Family: Not on file  . Frequency of Social Gatherings with Friends and Family: Not on file  . Attends Religious Services: Not on file  . Active Member of Clubs or Organizations: Not on file  . Attends Banker Meetings: Not on file  . Marital Status: Not on file  Intimate Partner Violence:   . Fear of Current or Ex-Partner: Not on file  . Emotionally Abused: Not on file  . Physically Abused: Not on file  . Sexually Abused: Not on file    No outpatient medications prior to visit.   No facility-administered medications prior to visit.      ROS:  Review of Systems  Constitutional: Negative for fever.  Gastrointestinal: Negative for blood in stool, constipation, diarrhea, nausea and vomiting.  Genitourinary: Positive for vaginal discharge. Negative for dyspareunia, dysuria, flank pain,  frequency, hematuria, urgency, vaginal bleeding and vaginal pain.  Musculoskeletal: Negative for back pain.  Skin: Negative for rash.   BREAST: No symptoms   OBJECTIVE:   Vitals:  BP 110/60 (BP Location: Left Arm, Patient Position: Sitting, Cuff Size: Normal)   Ht 5\' 4"  (1.626 m)   Wt 153 lb (69.4 kg)   LMP 11/30/2019   Breastfeeding Unknown   BMI 26.26 kg/m   Physical Exam Vitals reviewed.  Constitutional:      Appearance: She is well-developed.  Pulmonary:     Effort: Pulmonary effort is normal.  Musculoskeletal:        General: Normal range of motion.     Cervical back: Normal range of motion.  Skin:    General: Skin is warm and dry.  Neurological:     General: No focal deficit present.     Mental Status: She is alert and oriented to person, place, and time.      Cranial Nerves: No cranial nerve deficit.  Psychiatric:        Mood and Affect: Mood normal.        Behavior: Behavior normal.        Thought Content: Thought content normal.        Judgment: Judgment normal.     Assessment/Plan: Encounter for initial prescription of contraceptive pills - Plan: Norethindrone Acetate-Ethinyl Estrad-FE (MICROGESTIN 24 FE) 1-20 MG-MCG(24) tablet; OCP start today. Condoms. F/u in 1-2 months at annual.   Vaginal discharge--no sx today. RTO with sx or at annual for wet prep, possible MDL culture. Cont repHresh in meantime, also see if OCPs help sx.    Meds ordered this encounter  Medications  . Norethindrone Acetate-Ethinyl Estrad-FE (MICROGESTIN 24 FE) 1-20 MG-MCG(24) tablet    Sig: Take 1 tablet by mouth daily.    Dispense:  28 tablet    Refill:  1    Order Specific Question:   Supervising Provider    Answer:   Gae Dry [[id number]]      Return in about 4 weeks (around 4/69/6295) for annual.  Hisae Decoursey B. Cashay Manganelli, PA-C 12/05/2019 3:24 PM

## 2019-12-05 NOTE — Patient Instructions (Signed)
I value your feedback and entrusting us with your care. If you get a [facility] patient survey, I would appreciate you taking the time to let us know about your experience today. Thank you!  As of November 15, 2019, your lab results will be released to your MyChart immediately, before I even have a chance to see them. Please give me time to review them and contact you if there are any abnormalities. Thank you for your patience.  

## 2020-01-02 ENCOUNTER — Ambulatory Visit: Payer: BC Managed Care – PPO | Admitting: Obstetrics and Gynecology

## 2020-01-02 NOTE — Progress Notes (Deleted)
PCP:  [place]   No chief complaint on file.    HPI:      [name] is a 29 y.o. [phone] who LMP was No LMP recorded., presents today for her annual examination.  Her menses are {norm/abn:715}, lasting {number:22536} days.  Dysmenorrhea {dysmen:716}. She {does:18564} have intermenstrual bleeding.  DEAD SMELL before and after menses  Sex activity: {sex active:315163}. OCPs started 12/20 Last Pap: {JKKX:[id number]}  Results were: {norm/abn:16707::"no abnormalities"} /neg HPV DNA *** Hx of STDs: {STD hx:14358}  There is no FH of breast cancer. There is no FH of ovarian cancer. The patient {does:18564} do self-breast exams.  Tobacco use: {tob:20664} Alcohol use: {Alcohol:11675} No drug use.  Exercise: {exercise:31265}  She {does:18564} get adequate calcium and Vitamin D in her diet.   There are no problems to display for this patient.   Past Surgical History:  Procedure Laterality Date  . CESAREAN SECTION  2015  . NO PAST SURGERIES      Family History  Problem Relation Age of Onset  . Other Father        accident    Social History   Socioeconomic History  . Marital status: Single    Spouse name: Not on file  . Number of children: Not on file  . Years of education: Not on file  . Highest education level: Not on file  Occupational History  . Not on file  Tobacco Use  . Smoking status: Never Smoker  . Smokeless tobacco: Never Used  Substance and Sexual Activity  . Alcohol use: Yes    Comment: occ  . Drug use: No  . Sexual activity: Yes    Birth control/protection: None  Other Topics Concern  . Not on file  Social History Narrative  . Not on file   Social Determinants of Health   Financial Resource Strain:   . Difficulty of Paying Living Expenses: Not on file  Food Insecurity:   . Worried About Charity fundraiser in the Last Year: Not on file  . Ran Out of Food in the Last Year: Not on file  Transportation Needs:   . Lack  of Transportation (Medical): Not on file  . Lack of Transportation (Non-Medical): Not on file  Physical Activity:   . Days of Exercise per Week: Not on file  . Minutes of Exercise per Session: Not on file  Stress:   . Feeling of Stress : Not on file  Social Connections:   . Frequency of Communication with Friends and Family: Not on file  . Frequency of Social Gatherings with Friends and Family: Not on file  . Attends Religious Services: Not on file  . Active Member of Clubs or Organizations: Not on file  . Attends Archivist Meetings: Not on file  . Marital Status: Not on file  Intimate Partner Violence:   . Fear of Current or Ex-Partner: Not on file  . Emotionally Abused: Not on file  . Physically Abused: Not on file  . Sexually Abused: Not on file     Current Outpatient Medications:  .  Norethindrone Acetate-Ethinyl Estrad-FE (MICROGESTIN 24 FE) 1-20 MG-MCG(24) tablet, Take 1 tablet by mouth daily., Disp: 28 tablet, Rfl: 1     ROS:  Review of Systems BREAST: No symptoms   Objective: There were no vitals taken for this visit.   OBGyn Exam  Results: No results found for this or any previous visit (from the past 24 hour(s)).  Assessment/Plan: No diagnosis found.  No orders of the defined types were placed in this encounter.            GYN counsel {counseling:16159}     F/U  No follow-ups on file.  Panda Crossin B. Demarrius Guerrero, PA-C 01/02/2020 10:46 AM

## 2020-01-22 ENCOUNTER — Other Ambulatory Visit: Payer: Self-pay | Admitting: Obstetrics and Gynecology

## 2020-01-22 DIAGNOSIS — Z30011 Encounter for initial prescription of contraceptive pills: Secondary | ICD-10-CM

## 2020-01-29 ENCOUNTER — Other Ambulatory Visit: Payer: Self-pay | Admitting: Obstetrics and Gynecology

## 2020-01-29 ENCOUNTER — Telehealth: Payer: Self-pay | Admitting: Obstetrics and Gynecology

## 2020-01-29 ENCOUNTER — Other Ambulatory Visit: Payer: Self-pay

## 2020-01-29 DIAGNOSIS — Z30011 Encounter for initial prescription of contraceptive pills: Secondary | ICD-10-CM

## 2020-01-29 MED ORDER — MICROGESTIN 24 FE 1-20 MG-MCG PO TABS: 1.0000 | ORAL_TABLET | Freq: Every day | ORAL | 0 refills | Status: DC

## 2020-01-29 NOTE — Telephone Encounter (Signed)
Patient is schedule for 02/21/20 at 1:30 with ABC

## 2020-01-29 NOTE — Telephone Encounter (Signed)
RF sent.

## 2020-01-29 NOTE — Telephone Encounter (Signed)
Patient needs refill on bc, took her last pill today, scheduled for annual 3/18.  [facility].

## 2020-02-18 ENCOUNTER — Other Ambulatory Visit: Payer: Self-pay | Admitting: Obstetrics and Gynecology

## 2020-02-18 DIAGNOSIS — Z30011 Encounter for initial prescription of contraceptive pills: Secondary | ICD-10-CM

## 2020-02-21 ENCOUNTER — Ambulatory Visit (INDEPENDENT_AMBULATORY_CARE_PROVIDER_SITE_OTHER): Payer: BC Managed Care – PPO | Admitting: Obstetrics and Gynecology

## 2020-02-21 ENCOUNTER — Other Ambulatory Visit: Payer: Self-pay

## 2020-02-21 ENCOUNTER — Encounter: Payer: Self-pay | Admitting: Obstetrics and Gynecology

## 2020-02-21 ENCOUNTER — Other Ambulatory Visit (HOSPITAL_COMMUNITY)
Admission: RE | Admit: 2020-02-21 | Discharge: 2020-02-21 | Disposition: A | Discharge status: Home / Self Care | Payer: Self-pay | Source: Ambulatory Visit | Attending: Obstetrics and Gynecology | Admitting: Obstetrics and Gynecology

## 2020-02-21 VITALS — BP 120/70 | Ht 63.0 in | Wt 155.0 lb

## 2020-02-21 DIAGNOSIS — Z01419 Encounter for gynecological examination (general) (routine) without abnormal findings: Secondary | ICD-10-CM

## 2020-02-21 DIAGNOSIS — Z3041 Encounter for surveillance of contraceptive pills: Secondary | ICD-10-CM

## 2020-02-21 DIAGNOSIS — Z124 Encounter for screening for malignant neoplasm of cervix: Secondary | ICD-10-CM | POA: Insufficient documentation

## 2020-02-21 DIAGNOSIS — B9689 Other specified bacterial agents as the cause of diseases classified elsewhere: Secondary | ICD-10-CM

## 2020-02-21 DIAGNOSIS — Z01411 Encounter for gynecological examination (general) (routine) with abnormal findings: Secondary | ICD-10-CM | POA: Diagnosis not present

## 2020-02-21 DIAGNOSIS — N921 Excessive and frequent menstruation with irregular cycle: Secondary | ICD-10-CM | POA: Diagnosis not present

## 2020-02-21 DIAGNOSIS — N760 Acute vaginitis: Secondary | ICD-10-CM | POA: Diagnosis not present

## 2020-02-21 LAB — POCT WET PREP WITH KOH
Clue Cells Wet Prep HPF POC: POSITIVE
KOH Prep POC: POSITIVE — AB
Trichomonas, UA: NEGATIVE
Yeast Wet Prep HPF POC: NEGATIVE

## 2020-02-21 MED ORDER — METRONIDAZOLE 500 MG PO TABS: 500.0000 mg | ORAL_TABLET | Freq: Two times a day (BID) | ORAL | 0 refills | Status: DC

## 2020-02-21 MED ORDER — BLISOVI 24 FE 1-20 MG-MCG(24) PO TABS: 1.0000 | ORAL_TABLET | Freq: Every day | ORAL | 3 refills | Status: DC

## 2020-02-21 NOTE — Progress Notes (Signed)
PCP:  Patient, No Pcp Per   Chief Complaint  Patient presents with  . Gynecologic Exam     HPI:      [name] is a 29 y.o. [phone] who LMP was Patient's last menstrual period was 01/30/2020 (approximate)., presents today for her annual examination.  Her menses are irregular since starting OCPs 12/20. On 3rd pill pack. Has random bleeding/spotting. Dysmenorrhea none.   Sex activity: single partner, contraception - OCP (estrogen/progesterone).  Last Pap: 09/10/15 Results: no abnormalities; neg STD testing ~6/20 per pt (with PCP) Hx of STDs: chlamydia in distant past  Complains of vaginal odor, particularly around menses. Has been going on for a few months recently. Treats with repHresh OTC with some relief. Has sx today. Not using condoms, not taking probiotics, has not tried boric acid supp. Hx of BV in past and feels like sx are recurrent, triggered by sex and period.  There is no FH of breast cancer. There is no FH of ovarian cancer. The patient does not do self-breast exams.  Tobacco use: The patient denies current or previous tobacco use. Alcohol use: social drinker No drug use.  Exercise: moderately active  She does get adequate calcium and Vitamin D in her diet.   Past Medical History:  Diagnosis Date  . No known health problems     Past Surgical History:  Procedure Laterality Date  . CESAREAN SECTION  2015  . NO PAST SURGERIES      Family History  Problem Relation Age of Onset  . Other Father        accident    Social History   Socioeconomic History  . Marital status: Single    Spouse name: Not on file  . Number of children: Not on file  . Years of education: Not on file  . Highest education level: Not on file  Occupational History  . Not on file  Tobacco Use  . Smoking status: Never Smoker  . Smokeless tobacco: Never Used  Substance and Sexual Activity  . Alcohol use: Yes    Comment: occ  . Drug use: No  . Sexual activity: Yes     Birth control/protection: Pill  Other Topics Concern  . Not on file  Social History Narrative  . Not on file   Social Determinants of Health   Financial Resource Strain:   . Difficulty of Paying Living Expenses:   Food Insecurity:   . Worried About Programme researcher, broadcasting/film/video in the Last Year:   . Barista in the Last Year:   Transportation Needs:   . Freight forwarder (Medical):   Marland Kitchen Lack of Transportation (Non-Medical):   Physical Activity:   . Days of Exercise per Week:   . Minutes of Exercise per Session:   Stress:   . Feeling of Stress :   Social Connections:   . Frequency of Communication with Friends and Family:   . Frequency of Social Gatherings with Friends and Family:   . Attends Religious Services:   . Active Member of Clubs or Organizations:   . Attends Banker Meetings:   Marland Kitchen Marital Status:   Intimate Partner Violence:   . Fear of Current or Ex-Partner:   . Emotionally Abused:   Marland Kitchen Physically Abused:   . Sexually Abused:      Current Outpatient Medications:  .  Norethindrone Acetate-Ethinyl Estrad-FE (BLISOVI 24 FE) 1-20 MG-MCG(24) tablet, Take 1 tablet by mouth daily., Disp: 84 tablet,  Rfl: 3 .  metroNIDAZOLE (FLAGYL) 500 MG tablet, Take 1 tablet (500 mg total) by mouth 2 (two) times daily for 7 days., Disp: 14 tablet, Rfl: 0     ROS:  Review of Systems  Constitutional: Negative for fatigue, fever and unexpected weight change.  Respiratory: Negative for cough, shortness of breath and wheezing.   Cardiovascular: Negative for chest pain, palpitations and leg swelling.  Gastrointestinal: Negative for blood in stool, constipation, diarrhea, nausea and vomiting.  Endocrine: Negative for cold intolerance, heat intolerance and polyuria.  Genitourinary: Positive for vaginal discharge. Negative for dyspareunia, dysuria, flank pain, frequency, genital sores, hematuria, menstrual problem, pelvic pain, urgency, vaginal bleeding and vaginal pain.   Musculoskeletal: Negative for back pain, joint swelling and myalgias.  Skin: Negative for rash.  Neurological: Negative for dizziness, syncope, light-headedness, numbness and headaches.  Hematological: Negative for adenopathy.  Psychiatric/Behavioral: Negative for agitation, confusion, sleep disturbance and suicidal ideas. The patient is not nervous/anxious.   BREAST: No symptoms   Objective: BP 120/70   Ht 5\' 3"  (1.6 m)   Wt 155 lb (70.3 kg)   LMP 01/30/2020 (Approximate)   Breastfeeding No   BMI 27.46 kg/m    Physical Exam Constitutional:      Appearance: She is well-developed.  Genitourinary:     Vulva, cervix, uterus, right adnexa and left adnexa normal.     No vulval lesion or tenderness noted.     Vaginal bleeding present.     No vaginal discharge, erythema or tenderness.     No cervical polyp.     Uterus is not enlarged or tender.     No right or left adnexal mass present.     Right adnexa not tender.     Left adnexa not tender.  Neck:     Thyroid: No thyromegaly.  Cardiovascular:     Rate and Rhythm: Normal rate and regular rhythm.     Heart sounds: Normal heart sounds. No murmur.  Pulmonary:     Effort: Pulmonary effort is normal.     Breath sounds: Normal breath sounds.  Chest:     Breasts:        Right: No mass, nipple discharge, skin change or tenderness.        Left: No mass, nipple discharge, skin change or tenderness.  Abdominal:     Palpations: Abdomen is soft.     Tenderness: There is no abdominal tenderness. There is no guarding.  Musculoskeletal:        General: Normal range of motion.     Cervical back: Normal range of motion.  Neurological:     General: No focal deficit present.     Mental Status: She is alert and oriented to person, place, and time.     Cranial Nerves: No cranial nerve deficit.  Skin:    General: Skin is warm and dry.  Psychiatric:        Mood and Affect: Mood normal.        Behavior: Behavior normal.        Thought  Content: Thought content normal.        Judgment: Judgment normal.  Vitals reviewed.     Results: Results for orders placed or performed in visit on 02/21/20 (from the past 24 hour(s))  POCT Wet Prep with KOH     Status: Abnormal   Collection Time: 02/21/20  2:11 PM  Result Value Ref Range   Trichomonas, UA Negative    Clue Cells Wet Prep HPF  POC pos    Epithelial Wet Prep HPF POC     Yeast Wet Prep HPF POC neg    Bacteria Wet Prep HPF POC     RBC Wet Prep HPF POC     WBC Wet Prep HPF POC     KOH Prep POC Positive (A) Negative    Assessment/Plan: Encounter for annual routine gynecological examination  Cervical cancer screening - Plan: Cytology - PAP  Encounter for surveillance of contraceptive pills - Plan: Norethindrone Acetate-Ethinyl Estrad-FE (BLISOVI 24 FE) 1-20 MG-MCG(24) tablet; OCP RF. Will change OCPs next month if BTB persists.  Breakthrough bleeding on OCPs--On 3rd pack of pills. F/u if sx persist next mo for OCP change.   Bacterial vaginosis - Plan: metroNIDAZOLE (FLAGYL) 500 MG tablet, POCT Wet Prep with KOH; Pos sx/exam. Rx flagyl. Will RF if sx recur. Add probiotics, boric acid supp, use condoms. F/u prn.   Meds ordered this encounter  Medications  . metroNIDAZOLE (FLAGYL) 500 MG tablet    Sig: Take 1 tablet (500 mg total) by mouth 2 (two) times daily for 7 days.    Dispense:  14 tablet    Refill:  0    Order Specific Question:   Supervising Provider    Answer:   Nadara Mustard [id number]  . Norethindrone Acetate-Ethinyl Estrad-FE (BLISOVI 24 FE) 1-20 MG-MCG(24) tablet    Sig: Take 1 tablet by mouth daily.    Dispense:  84 tablet    Refill:  3    Order Specific Question:   Supervising Provider    Answer:   Nadara Mustard [[id number]]             GYN counsel adequate intake of calcium and vitamin D, diet and exercise     F/U  Return in about 1 year (around 02/20/2021).  Elesia Pemberton B. Ambre Kobayashi, PA-C 02/21/2020 2:35 PM

## 2020-02-21 NOTE — Patient Instructions (Signed)
I value your feedback and entrusting us with your care. If you get a Vanessa Vang patient survey, I would appreciate you taking the time to let us know about your experience today. Thank you!  As of November 15, 2019, your lab results will be released to your MyChart immediately, before I even have a chance to see them. Please give me time to review them and contact you if there are any abnormalities. Thank you for your patience.  

## 2020-02-25 LAB — CYTOLOGY - PAP: Diagnosis: NEGATIVE

## 2020-03-24 ENCOUNTER — Other Ambulatory Visit: Payer: Self-pay | Admitting: Obstetrics and Gynecology

## 2020-03-24 ENCOUNTER — Encounter: Payer: Self-pay | Admitting: Obstetrics and Gynecology

## 2020-03-24 MED ORDER — DROSPIRENONE-ETHINYL ESTRADIOL 3-0.02 MG PO TABS: 1.0000 | ORAL_TABLET | Freq: Every day | ORAL | 2 refills | Status: DC

## 2020-03-24 NOTE — Progress Notes (Signed)
Rx change to yaz from lomedia due to BTB.

## 2020-06-16 ENCOUNTER — Other Ambulatory Visit: Payer: Self-pay | Admitting: Obstetrics and Gynecology

## 2020-06-16 ENCOUNTER — Encounter: Payer: Self-pay | Admitting: Obstetrics and Gynecology

## 2020-06-16 DIAGNOSIS — B9689 Other specified bacterial agents as the cause of diseases classified elsewhere: Secondary | ICD-10-CM

## 2020-06-16 MED ORDER — METRONIDAZOLE 500 MG PO TABS: 500.0000 mg | ORAL_TABLET | Freq: Two times a day (BID) | ORAL | 0 refills | Status: AC

## 2020-06-16 NOTE — Progress Notes (Signed)
Rx RF flagyl for BV 

## 2020-06-30 ENCOUNTER — Other Ambulatory Visit: Payer: Self-pay

## 2020-06-30 ENCOUNTER — Ambulatory Visit
Admission: EM | Admit: 2020-06-30 | Discharge: 2020-06-30 | Disposition: A | Discharge status: Home / Self Care | Payer: BC Managed Care – PPO | Attending: Family Medicine | Admitting: Family Medicine

## 2020-06-30 ENCOUNTER — Ambulatory Visit (INDEPENDENT_AMBULATORY_CARE_PROVIDER_SITE_OTHER): Payer: BC Managed Care – PPO

## 2020-06-30 DIAGNOSIS — F0781 Postconcussional syndrome: Secondary | ICD-10-CM | POA: Diagnosis not present

## 2020-06-30 DIAGNOSIS — S29012A Strain of muscle and tendon of back wall of thorax, initial encounter: Secondary | ICD-10-CM

## 2020-06-30 DIAGNOSIS — S40012A Contusion of left shoulder, initial encounter: Secondary | ICD-10-CM

## 2020-06-30 DIAGNOSIS — S60212A Contusion of left wrist, initial encounter: Secondary | ICD-10-CM

## 2020-06-30 DIAGNOSIS — S060X0A Concussion without loss of consciousness, initial encounter: Secondary | ICD-10-CM | POA: Diagnosis not present

## 2020-06-30 MED ORDER — CYCLOBENZAPRINE HCL 10 MG PO TABS
10.0000 mg | ORAL_TABLET | Freq: Every day | ORAL | 0 refills | Status: DC
Start: 2020-06-30 — End: 2022-02-17

## 2020-06-30 NOTE — Discharge Instructions (Addendum)
Over the counter tylenol/advil as needed, rest  Follow up with primary care provider within 1 week
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
## 2020-07-12 ENCOUNTER — Emergency Department (HOSPITAL_COMMUNITY): Payer: BC Managed Care – PPO

## 2020-07-12 ENCOUNTER — Encounter (HOSPITAL_COMMUNITY): Payer: Self-pay | Admitting: Emergency Medicine

## 2020-07-12 ENCOUNTER — Emergency Department (HOSPITAL_COMMUNITY)
Admission: EM | Admit: 2020-07-12 | Discharge: 2020-07-12 | Disposition: A | Discharge status: Home / Self Care | Payer: BC Managed Care – PPO | Attending: Emergency Medicine | Admitting: Emergency Medicine

## 2020-07-12 ENCOUNTER — Other Ambulatory Visit: Payer: Self-pay

## 2020-07-12 DIAGNOSIS — Y999 Unspecified external cause status: Secondary | ICD-10-CM | POA: Diagnosis not present

## 2020-07-12 DIAGNOSIS — Y9301 Activity, walking, marching and hiking: Secondary | ICD-10-CM | POA: Insufficient documentation

## 2020-07-12 DIAGNOSIS — S62101A Fracture of unspecified carpal bone, right wrist, initial encounter for closed fracture: Secondary | ICD-10-CM

## 2020-07-12 DIAGNOSIS — W010XXA Fall on same level from slipping, tripping and stumbling without subsequent striking against object, initial encounter: Secondary | ICD-10-CM | POA: Diagnosis not present

## 2020-07-12 DIAGNOSIS — Y9289 Other specified places as the place of occurrence of the external cause: Secondary | ICD-10-CM | POA: Insufficient documentation

## 2020-07-12 DIAGNOSIS — S52611A Displaced fracture of right ulna styloid process, initial encounter for closed fracture: Secondary | ICD-10-CM | POA: Diagnosis not present

## 2020-07-12 DIAGNOSIS — S6991XA Unspecified injury of right wrist, hand and finger(s), initial encounter: Secondary | ICD-10-CM | POA: Diagnosis present

## 2020-07-12 MED ORDER — OXYCODONE-ACETAMINOPHEN 5-325 MG PO TABS: 1.0000 | ORAL_TABLET | Freq: Four times a day (QID) | ORAL | 0 refills | Status: AC | PRN

## 2020-07-12 MED ORDER — OXYCODONE-ACETAMINOPHEN 5-325 MG PO TABS
1.0000 | ORAL_TABLET | Freq: Once | ORAL | Status: AC
  Administered 2020-07-12: 1 via ORAL
  Filled 2020-07-12: qty 1
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
## 2020-07-12 NOTE — ED Provider Notes (Signed)
[facility] EMERGENCY DEPARTMENT Provider Note   CSN: [id number] Arrival date & time: 07/12/20  0408     History No chief complaint on file.   Vanessa Vang is a 29 y.o. female.  HPI   Patient presents to the emergent department with chief complaint of right wrist pain that started around 4 AM this morning.  Patient states while she was out walking in high heels she tripped, falling backwards and used her right hand to brace her fall.  She landed directly on her wrist and felt a pop.  Since then she has been in excruciating pain, states it hurts when she moves her fingers as well as trying to bend her wrist.  She denies paresthesias in her right wrist, hitting her head, losing consciousness, being on anticoagulants.  Patient denies alleviating factors and admits moving the wrist makes the pain worse.  She has no known medical history, does not take any medications on a daily basis.  Patient denies headache, fever, chills, shortness of breath, chest pain, abdominal pain, dysuria, pedal edema. Past Medical History:  Diagnosis Date  . No known health problems     Patient Active Problem List   Diagnosis Date Noted  . Bacterial vaginosis 02/21/2020    Past Surgical History:  Procedure Laterality Date  . CESAREAN SECTION  2015  . NO PAST SURGERIES       OB History    Gravida  4   Para  1   Term  1   Preterm      AB  3   Living  1     SAB  1   TAB  2   Ectopic      Multiple      Live Births  1           Family History  Problem Relation Age of Onset  . Other Father        accident    Social History   Tobacco Use  . Smoking status: Never Smoker  . Smokeless tobacco: Never Used  Vaping Use  . Vaping Use: Never used  Substance Use Topics  . Alcohol use: Yes    Comment: occ  . Drug use: No    Home Medications Prior to Admission medications   Medication Sig Start Date End Date Taking? Authorizing Provider  cyclobenzaprine  (FLEXERIL) 10 MG tablet Take 1 tablet (10 mg total) by mouth at bedtime. PRN 06/30/20   Payton Mccallum, MD  drospirenone-ethinyl estradiol (YAZ) 3-0.02 MG tablet Take 1 tablet by mouth daily. 03/24/20   Copland, Ilona Sorrel, PA-C  oxyCODONE-acetaminophen (PERCOCET/ROXICET) 5-325 MG tablet Take 1-2 tablets by mouth every 6 (six) hours as needed for up to 4 days for severe pain. 07/12/20 07/16/20  Carroll Sage, PA-C    Allergies    Patient has no known allergies.  Review of Systems   Review of Systems  Constitutional: Negative for chills and fever.  HENT: Negative for congestion and sore throat.   Respiratory: Negative for cough and shortness of breath.   Cardiovascular: Negative for chest pain.  Gastrointestinal: Negative for abdominal pain, diarrhea, nausea and vomiting.  Genitourinary: Negative for enuresis, flank pain and frequency.  Musculoskeletal: Positive for joint swelling. Negative for back pain.       Admits to right wrist pain  Skin: Negative for rash.  Neurological: Negative for dizziness and headaches.  Hematological: Does not bruise/bleed easily.    Physical Exam Updated Vital Signs  BP 119/82 (BP Location: Left Arm)   Pulse 60   Temp 98.8 F (37.1 C) (Oral)   Resp 18   Ht 5\' 4"  (1.626 m)   Wt 70.3 kg   LMP 06/30/2020   SpO2 98%   BMI 26.61 kg/m   Physical Exam Vitals and nursing note reviewed.  Constitutional:      General: She is not in acute distress.    Appearance: She is not ill-appearing.  HENT:     Head: Normocephalic and atraumatic.     Nose: No congestion.     Mouth/Throat:     Mouth: Mucous membranes are moist.     Pharynx: Oropharynx is clear.  Eyes:     General: No scleral icterus. Cardiovascular:     Rate and Rhythm: Normal rate and regular rhythm.     Pulses: Normal pulses.     Heart sounds: No murmur heard.  No friction rub. No gallop.   Pulmonary:     Effort: No respiratory distress.     Breath sounds: No wheezing, rhonchi or rales.   Abdominal:     General: There is no distension.     Tenderness: There is no abdominal tenderness. There is no right CVA tenderness, left CVA tenderness or guarding.  Musculoskeletal:        General: Swelling, tenderness and deformity present.     Comments: Patient's right wrist was visualized, there was marked swelling distal medial side of her radius.  There was deformity noted on the distal dorsal end of her radius.  She was able to ambulate her fingers at the distal middle and proximal joints, unable to move her wrist due to pain.  She had good radial pulses, good capillary refill, and and wrists were soft to the touch.  Skin:    General: Skin is warm and dry.     Capillary Refill: Capillary refill takes less than 2 seconds.     Findings: No rash.  Neurological:     Mental Status: She is alert and oriented to person, place, and time.  Psychiatric:        Mood and Affect: Mood normal.     ED Results / Procedures / Treatments   Labs (all labs ordered are listed, but only abnormal results are displayed) Labs Reviewed - No data to display  EKG None  Radiology DG Wrist Complete Right  Result Date: 07/12/2020 CLINICAL DATA:  Fall with swelling EXAM: RIGHT WRIST - COMPLETE 3+ VIEW COMPARISON:  None. FINDINGS: There is a comminuted, predominantly transverse, extra-articular fracture of the distal right radius with dorsal displacement. There is also a minimally displaced fracture of the ulnar styloid. IMPRESSION: 1. Comminuted, predominantly transverse, extra-articular fracture of the distal right radius with dorsal displacement. 2. Minimally displaced fracture of the ulnar styloid. Electronically Signed   By: 09/11/2020 M.D.   On: 07/12/2020 04:57    Procedures Procedures (including critical care time)  Medications Ordered in ED Medications  oxyCODONE-acetaminophen (PERCOCET/ROXICET) 5-325 MG per tablet 1 tablet (1 tablet Oral Given 07/12/20 09/11/20)    ED Course  I have reviewed  the triage vital signs and the nursing notes.  Pertinent labs & imaging results that were available during my care of the patient were reviewed by me and considered in my medical decision making (see chart for details).  Clinical Course as of Jul 12 944  Sat Jul 12, 2020  7181 29 year old female right-hand-dominant complaining of right wrist pain after fall.  Patient has some  deformity with intact pulses cap refill.  X-ray showing distal radius fracture.  Will review with hand surgery on-call likely splint and outpatient follow-up.   [MB]    Clinical Course User Index [MB] Terrilee Files, MD   MDM Rules/Calculators/A&P                          I have personally reviewed all imaging, labs and have interpreted them.   Patient did not appear to be in any acute distress on exam, vital signs are reassuring.  Exam of her right hand showed slight deformity at the distal end of her radius, neurovascularly intact.  No signs of compartment syndrome.  Will consult hand surgery and provide patient with pain medication.  08:30 spoke with Dr. Melvyn Novas who advised that patient needs surgery to reduce the fracture and offers to bring the patient to surgery today or she can be seen Monday and schedule surgery the following week.  Spoke with patient she states that she would rather have the surgery done later in the week.  Will place patient in splint and have her follow-up with hand surgery Monday.  I have low suspicion for compartment syndrome as neurovascular is fully intact, she had good radial pulses, good capillary refill, wrist and hand were not hard to the touch.  Low suspicion for an open fracture as there is no wound, laceration or abrasion noted on exam.  Unlikely patient suffering from ligament or tendon damage as she was able to articulate her fingers.  Patient appears to be resting calmly in bed showing no acute signs of distress.  Vital signs remained stable does not meet criteria to be  admitted to the hospital.  Patient suffered a fracture of her right wrist and will need surgical repair.  Spoke with hand surgery and will see patient this week for further evaluation.  Patient was placed in a splint and given pain medications.  Patient was discussed with attending who agreed with assessment and plan.  Patient was given at home care as well as strict return precautions.  Patient verbalized that she understood and agreed with said plan.   Final Clinical Impression(s) / ED Diagnoses Final diagnoses:  Closed fracture of right wrist, initial encounter    Rx / DC Orders ED Discharge Orders         Ordered    oxyCODONE-acetaminophen (PERCOCET/ROXICET) 5-325 MG tablet  Every 6 hours PRN     Discontinue  Reprint     07/12/20 0911           Carroll Sage, PA-C 07/12/20 0945    Terrilee Files, MD 07/12/20 1754

## 2020-07-12 NOTE — Discharge Instructions (Addendum)
You have been seen here after a fall.  X-ray showed that you have a right wrist fracture.  I placed you in a splint, please keep splint on.  I have also provided you pain medication please take as prescribed.  Do not operate heavy machinery or take with alcohol as this can cause you become very drowsy.  You may take ibuprofen as well for pain every 6 hours please follow dosing on the back of bottle.  Do not take with Tylenol as this medication contains Tylenol in it.  Keep the arm elevated and apply ice to the area as this can help with swelling and pain management.  I have given you contact information for [facility] you will be seeing Dr. Melvyn Novas I want you to call them on Monday to schedule appointment as you need surgery to reduce the fracture.  I want to come back to the emergency department if you develop pins and needle sensation in your hand, your hand turns a different color, you are unable to move your fingers, chest pain, shortness of breath, uncontrolled nausea, vomiting, diarrhea as the symptoms require further evaluation and management.

## 2020-07-12 NOTE — ED Notes (Signed)
Ortho tech paged  

## 2020-07-12 NOTE — ED Notes (Signed)
Patient given discharge instructions. Questions were answered. Patient verbalized understanding of discharge instructions and care at home.  

## 2020-07-12 NOTE — Progress Notes (Signed)
Orthopedic Tech Progress Note Patient Details:  Vanessa Vang Jun 08, 1991 [id number]  Ortho Devices Type of Ortho Device: Sugartong splint, Sling immobilizer Ortho Device/Splint Location: Right Upper Extremity Ortho Device/Splint Interventions: Ordered, Application, Adjustment   Post Interventions Patient Tolerated: Well Instructions Provided: Adjustment of device, Care of device, Poper ambulation with device   [name] P Harle Stanford 07/12/2020, 9:43 AM

## 2020-09-26 ENCOUNTER — Encounter: Payer: Self-pay | Admitting: Obstetrics and Gynecology

## 2020-12-17 ENCOUNTER — Other Ambulatory Visit: Payer: Self-pay | Admitting: Obstetrics and Gynecology

## 2020-12-19 ENCOUNTER — Other Ambulatory Visit: Payer: Self-pay | Admitting: Obstetrics and Gynecology

## 2020-12-24 MED ORDER — DROSPIRENONE-ETHINYL ESTRADIOL 3-0.02 MG PO TABS
1.0000 | ORAL_TABLET | Freq: Every day | ORAL | 0 refills | Status: DC
Start: 2020-12-24 — End: 2021-02-25

## 2020-12-24 NOTE — Addendum Note (Signed)
Addended by: Donnetta Hail on: 12/24/2020 09:10 AM   Modules accepted: Orders

## 2021-02-19 NOTE — Patient Instructions (Incomplete)
I value your feedback and you entrusting us with your care. If you get a [facility] patient survey, I would appreciate you taking the time to let us know about your experience today. Thank you! ? ? ?

## 2021-02-19 NOTE — Progress Notes (Deleted)
PCP:  Patient, No Pcp Per   No chief complaint on file.    HPI:      [name] is a 30 y.o. [phone] who LMP was No LMP recorded., presents today for her annual examination.  Her menses are irregular since starting OCPs 12/20. On 3rd pill pack. Has random bleeding/spotting. Dysmenorrhea none.   Sex activity: single partner, contraception - OCP (estrogen/progesterone).  Last Pap: 02/21/20 Results: no abnormalities Hx of STDs: chlamydia in distant past  Hx of BV Complains of vaginal odor, particularly around menses. Has been going on for a few months recently. Treats with repHresh OTC with some relief. Has sx today. Not using condoms, not taking probiotics, has not tried boric acid supp. Hx of BV in past and feels like sx are recurrent, triggered by sex and period.  There is no FH of breast cancer. There is no FH of ovarian cancer. The patient does not do self-breast exams.  Tobacco use: The patient denies current or previous tobacco use. Alcohol use: social drinker No drug use.  Exercise: moderately active  She does get adequate calcium and Vitamin D in her diet.   Past Medical History:  Diagnosis Date  . No known health problems     Past Surgical History:  Procedure Laterality Date  . CESAREAN SECTION  2015  . NO PAST SURGERIES      Family History  Problem Relation Age of Onset  . Other Father        accident    Social History   Socioeconomic History  . Marital status: Single    Spouse name: Not on file  . Number of children: Not on file  . Years of education: Not on file  . Highest education level: Not on file  Occupational History  . Not on file  Tobacco Use  . Smoking status: Never Smoker  . Smokeless tobacco: Never Used  Vaping Use  . Vaping Use: Never used  Substance and Sexual Activity  . Alcohol use: Yes    Comment: occ  . Drug use: No  . Sexual activity: Yes    Birth control/protection: Pill  Other Topics Concern  . Not on file   Social History Narrative  . Not on file   Social Determinants of Health   Financial Resource Strain: Not on file  Food Insecurity: Not on file  Transportation Needs: Not on file  Physical Activity: Not on file  Stress: Not on file  Social Connections: Not on file  Intimate Partner Violence: Not on file     Current Outpatient Medications:  .  cyclobenzaprine (FLEXERIL) 10 MG tablet, Take 1 tablet (10 mg total) by mouth at bedtime. PRN, Disp: 30 tablet, Rfl: 0 .  drospirenone-ethinyl estradiol (YAZ) 3-0.02 MG tablet, Take 1 tablet by mouth daily., Disp: 84 tablet, Rfl: 0     ROS:  Review of Systems  Constitutional: Negative for fatigue, fever and unexpected weight change.  Respiratory: Negative for cough, shortness of breath and wheezing.   Cardiovascular: Negative for chest pain, palpitations and leg swelling.  Gastrointestinal: Negative for blood in stool, constipation, diarrhea, nausea and vomiting.  Endocrine: Negative for cold intolerance, heat intolerance and polyuria.  Genitourinary: Positive for vaginal discharge. Negative for dyspareunia, dysuria, flank pain, frequency, genital sores, hematuria, menstrual problem, pelvic pain, urgency, vaginal bleeding and vaginal pain.  Musculoskeletal: Negative for back pain, joint swelling and myalgias.  Skin: Negative for rash.  Neurological: Negative for dizziness, syncope, light-headedness, numbness  and headaches.  Hematological: Negative for adenopathy.  Psychiatric/Behavioral: Negative for agitation, confusion, sleep disturbance and suicidal ideas. The patient is not nervous/anxious.   BREAST: No symptoms   Objective: There were no vitals taken for this visit.   Physical Exam Constitutional:      Appearance: She is well-developed.  Genitourinary:     Vulva normal.     Vaginal bleeding present.     No vaginal discharge, erythema or tenderness.      Right Adnexa: not tender and no mass present.    Left Adnexa: not  tender and no mass present.    No cervical polyp.     Uterus is not enlarged or tender.  Breasts:     Right: No mass, nipple discharge, skin change or tenderness.     Left: No mass, nipple discharge, skin change or tenderness.    Neck:     Thyroid: No thyromegaly.  Cardiovascular:     Rate and Rhythm: Normal rate and regular rhythm.     Heart sounds: Normal heart sounds. No murmur heard.   Pulmonary:     Effort: Pulmonary effort is normal.     Breath sounds: Normal breath sounds.  Abdominal:     Palpations: Abdomen is soft.     Tenderness: There is no abdominal tenderness. There is no guarding.  Musculoskeletal:        General: Normal range of motion.     Cervical back: Normal range of motion.  Neurological:     General: No focal deficit present.     Mental Status: She is alert and oriented to person, place, and time.     Cranial Nerves: No cranial nerve deficit.  Skin:    General: Skin is warm and dry.  Psychiatric:        Mood and Affect: Mood normal.        Behavior: Behavior normal.        Thought Content: Thought content normal.        Judgment: Judgment normal.  Vitals reviewed.     Results: No results found for this or any previous visit (from the past 24 hour(s)).  Assessment/Plan: Encounter for annual routine gynecological examination  Cervical cancer screening - Plan: Cytology - PAP  Encounter for surveillance of contraceptive pills - Plan: Norethindrone Acetate-Ethinyl Estrad-FE (BLISOVI 24 FE) 1-20 MG-MCG(24) tablet; OCP RF. Will change OCPs next month if BTB persists.  Breakthrough bleeding on OCPs--On 3rd pack of pills. F/u if sx persist next mo for OCP change.   Bacterial vaginosis - Plan: metroNIDAZOLE (FLAGYL) 500 MG tablet, POCT Wet Prep with KOH; Pos sx/exam. Rx flagyl. Will RF if sx recur. Add probiotics, boric acid supp, use condoms. F/u prn.   No orders of the defined types were placed in this encounter.            GYN counsel adequate  intake of calcium and vitamin D, diet and exercise     F/U  No follow-ups on file.  Raahim Shartzer B. Dezarai Prew, PA-C 02/19/2021 10:57 AM

## 2021-02-23 ENCOUNTER — Ambulatory Visit: Payer: BC Managed Care – PPO | Admitting: Obstetrics and Gynecology

## 2021-02-23 ENCOUNTER — Other Ambulatory Visit: Payer: Self-pay | Admitting: Obstetrics and Gynecology

## 2021-02-23 DIAGNOSIS — Z01419 Encounter for gynecological examination (general) (routine) without abnormal findings: Secondary | ICD-10-CM

## 2021-02-23 DIAGNOSIS — Z3041 Encounter for surveillance of contraceptive pills: Secondary | ICD-10-CM

## 2021-02-25 MED ORDER — DROSPIRENONE-ETHINYL ESTRADIOL 3-0.02 MG PO TABS: 1.0000 | ORAL_TABLET | Freq: Every day | ORAL | 0 refills | Status: DC

## 2021-02-25 NOTE — Addendum Note (Signed)
Addended by: Loran Senters D on: 02/25/2021 09:21 AM   Modules accepted: Orders

## 2021-03-12 ENCOUNTER — Ambulatory Visit: Payer: BC Managed Care – PPO | Admitting: Obstetrics and Gynecology

## 2021-03-12 NOTE — Patient Instructions (Incomplete)
I value your feedback and you entrusting us with your care. If you get a [facility] patient survey, I would appreciate you taking the time to let us know about your experience today. Thank you! ? ? ?

## 2021-03-12 NOTE — Progress Notes (Deleted)
PCP:  Patient, No Pcp Per (Inactive)   No chief complaint on file.    HPI:      [name] is a 30 y.o. [phone] who LMP was No LMP recorded., presents today for her annual examination.  Her menses are irregular since starting OCPs 12/20. On 3rd pill pack. Has random bleeding/spotting. Dysmenorrhea none.   Sex activity: single partner, contraception - OCP (estrogen/progesterone).  Last Pap: 02/21/20 Results: no abnormalities Hx of STDs: chlamydia in distant past  Hx of BV Complains of vaginal odor, particularly around menses. Has been going on for a few months recently. Treats with repHresh OTC with some relief. Has sx today. Not using condoms, not taking probiotics, has not tried boric acid supp. Hx of BV in past and feels like sx are recurrent, triggered by sex and period.  There is no FH of breast cancer. There is no FH of ovarian cancer. The patient does not do self-breast exams.  Tobacco use: The patient denies current or previous tobacco use. Alcohol use: social drinker No drug use.  Exercise: moderately active  She does get adequate calcium and Vitamin D in her diet.   Past Medical History:  Diagnosis Date  . No known health problems     Past Surgical History:  Procedure Laterality Date  . CESAREAN SECTION  2015  . NO PAST SURGERIES      Family History  Problem Relation Age of Onset  . Other Father        accident    Social History   Socioeconomic History  . Marital status: Single    Spouse name: Not on file  . Number of children: Not on file  . Years of education: Not on file  . Highest education level: Not on file  Occupational History  . Not on file  Tobacco Use  . Smoking status: Never Smoker  . Smokeless tobacco: Never Used  Vaping Use  . Vaping Use: Never used  Substance and Sexual Activity  . Alcohol use: Yes    Comment: occ  . Drug use: No  . Sexual activity: Yes    Birth control/protection: Pill  Other Topics Concern  . Not  on file  Social History Narrative  . Not on file   Social Determinants of Health   Financial Resource Strain: Not on file  Food Insecurity: Not on file  Transportation Needs: Not on file  Physical Activity: Not on file  Stress: Not on file  Social Connections: Not on file  Intimate Partner Violence: Not on file     Current Outpatient Medications:  .  cyclobenzaprine (FLEXERIL) 10 MG tablet, Take 1 tablet (10 mg total) by mouth at bedtime. PRN, Disp: 30 tablet, Rfl: 0 .  drospirenone-ethinyl estradiol (YAZ) 3-0.02 MG tablet, Take 1 tablet by mouth daily., Disp: 84 tablet, Rfl: 0     ROS:  Review of Systems  Constitutional: Negative for fatigue, fever and unexpected weight change.  Respiratory: Negative for cough, shortness of breath and wheezing.   Cardiovascular: Negative for chest pain, palpitations and leg swelling.  Gastrointestinal: Negative for blood in stool, constipation, diarrhea, nausea and vomiting.  Endocrine: Negative for cold intolerance, heat intolerance and polyuria.  Genitourinary: Positive for vaginal discharge. Negative for dyspareunia, dysuria, flank pain, frequency, genital sores, hematuria, menstrual problem, pelvic pain, urgency, vaginal bleeding and vaginal pain.  Musculoskeletal: Negative for back pain, joint swelling and myalgias.  Skin: Negative for rash.  Neurological: Negative for dizziness, syncope, light-headedness,  numbness and headaches.  Hematological: Negative for adenopathy.  Psychiatric/Behavioral: Negative for agitation, confusion, sleep disturbance and suicidal ideas. The patient is not nervous/anxious.   BREAST: No symptoms   Objective: There were no vitals taken for this visit.   Physical Exam Constitutional:      Appearance: She is well-developed.  Genitourinary:     Vulva normal.     Vaginal bleeding present.     No vaginal discharge, erythema or tenderness.      Right Adnexa: not tender and no mass present.    Left  Adnexa: not tender and no mass present.    No cervical polyp.     Uterus is not enlarged or tender.  Breasts:     Right: No mass, nipple discharge, skin change or tenderness.     Left: No mass, nipple discharge, skin change or tenderness.    Neck:     Thyroid: No thyromegaly.  Cardiovascular:     Rate and Rhythm: Normal rate and regular rhythm.     Heart sounds: Normal heart sounds. No murmur heard.   Pulmonary:     Effort: Pulmonary effort is normal.     Breath sounds: Normal breath sounds.  Abdominal:     Palpations: Abdomen is soft.     Tenderness: There is no abdominal tenderness. There is no guarding.  Musculoskeletal:        General: Normal range of motion.     Cervical back: Normal range of motion.  Neurological:     General: No focal deficit present.     Mental Status: She is alert and oriented to person, place, and time.     Cranial Nerves: No cranial nerve deficit.  Skin:    General: Skin is warm and dry.  Psychiatric:        Mood and Affect: Mood normal.        Behavior: Behavior normal.        Thought Content: Thought content normal.        Judgment: Judgment normal.  Vitals reviewed.     Results: No results found for this or any previous visit (from the past 24 hour(s)).  Assessment/Plan: Encounter for annual routine gynecological examination  Cervical cancer screening - Plan: Cytology - PAP  Encounter for surveillance of contraceptive pills - Plan: Norethindrone Acetate-Ethinyl Estrad-FE (BLISOVI 24 FE) 1-20 MG-MCG(24) tablet; OCP RF. Will change OCPs next month if BTB persists.  Breakthrough bleeding on OCPs--On 3rd pack of pills. F/u if sx persist next mo for OCP change.   Bacterial vaginosis - Plan: metroNIDAZOLE (FLAGYL) 500 MG tablet, POCT Wet Prep with KOH; Pos sx/exam. Rx flagyl. Will RF if sx recur. Add probiotics, boric acid supp, use condoms. F/u prn.   No orders of the defined types were placed in this encounter.            GYN counsel  adequate intake of calcium and vitamin D, diet and exercise     F/U  No follow-ups on file.  Bridger Pizzi B. Monnie Gudgel, PA-C 03/12/2021 8:53 AM

## 2021-04-17 ENCOUNTER — Other Ambulatory Visit: Payer: Self-pay

## 2021-04-17 ENCOUNTER — Ambulatory Visit
Admission: RE | Admit: 2021-04-17 | Discharge: 2021-04-17 | Disposition: A | Discharge status: Home / Self Care | Payer: BC Managed Care – PPO | Source: Ambulatory Visit | Attending: Emergency Medicine | Admitting: Emergency Medicine

## 2021-04-17 VITALS — BP 117/86 | HR 77 | Temp 98.1°F | Resp 16 | Wt 155.0 lb

## 2021-04-17 DIAGNOSIS — B349 Viral infection, unspecified: Secondary | ICD-10-CM | POA: Diagnosis not present

## 2021-04-17 DIAGNOSIS — Z1152 Encounter for screening for COVID-19: Secondary | ICD-10-CM

## 2021-04-17 NOTE — Discharge Instructions (Addendum)
Your COVID and Influenza tests are pending.  You should self quarantine until the test results are back.    Take Tylenol or ibuprofen as needed for fever or discomfort.  Rest and keep yourself hydrated.    Follow-up with your primary care provider if your symptoms are not improving.     

## 2021-04-17 NOTE — ED Triage Notes (Signed)
Pt reports having non productive cough, sore throat, and generalized body aches. Symptoms began yesterday. Neg covid test yesterday at [facility].

## 2021-04-17 NOTE — ED Provider Notes (Signed)
Vanessa Vang    CSN: [id number] Arrival date & time: 04/17/21  1131      History   Chief Complaint Chief Complaint  Patient presents with  . Cough    HPI Vanessa Vang is a 30 y.o. female.   Patient presents with 1 day history of headache, body aches, sore throat, nasal congestion, postnasal drip, runny nose, cough.  She denies rash, vomiting, diarrhea, shortness of breath, or other symptoms.  OTC treatment attempted.  She reports negative PCR COVID test yesterday.  No pertinent medical history.  The history is provided by the patient.    Past Medical History:  Diagnosis Date  . No known health problems     Patient Active Problem List   Diagnosis Date Noted  . Bacterial vaginosis 02/21/2020    Past Surgical History:  Procedure Laterality Date  . CESAREAN SECTION  2015  . NO PAST SURGERIES      OB History    Gravida  4   Para  1   Term  1   Preterm      AB  3   Living  1     SAB  1   IAB  2   Ectopic      Multiple      Live Births  1            Home Medications    Prior to Admission medications   Medication Sig Start Date End Date Taking? Authorizing Provider  cyclobenzaprine (FLEXERIL) 10 MG tablet Take 1 tablet (10 mg total) by mouth at bedtime. PRN 06/30/20   Payton Mccallum, MD  drospirenone-ethinyl estradiol (YAZ) 3-0.02 MG tablet Take 1 tablet by mouth daily. 02/25/21   Copland, Ilona Sorrel, PA-C    Family History Family History  Problem Relation Age of Onset  . Other Father        accident    Social History Social History   Tobacco Use  . Smoking status: Never Smoker  . Smokeless tobacco: Never Used  Vaping Use  . Vaping Use: Never used  Substance Use Topics  . Alcohol use: Yes    Comment: occ  . Drug use: No     Allergies   Patient has no known allergies.   Review of Systems Review of Systems  Constitutional: Negative for chills and fever.  HENT: Positive for congestion, postnasal drip, rhinorrhea and  sore throat. Negative for ear pain.   Respiratory: Positive for cough. Negative for shortness of breath.   Cardiovascular: Negative for chest pain and palpitations.  Gastrointestinal: Negative for abdominal pain, diarrhea and vomiting.  Skin: Negative for color change and rash.  Neurological: Positive for headaches. Negative for weakness and numbness.  All other systems reviewed and are negative.    Physical Exam Triage Vital Signs ED Triage Vitals  Enc Vitals Group     BP 04/17/21 1149 117/86     Pulse Rate 04/17/21 1149 77     Resp 04/17/21 1149 16     Temp 04/17/21 1149 98.1 F (36.7 C)     Temp Source 04/17/21 1149 Oral     SpO2 04/17/21 1149 98 %     Weight 04/17/21 1150 155 lb (70.3 kg)     Height --      Head Circumference --      Peak Flow --      Pain Score 04/17/21 1150 8     Pain Loc --      Pain  Edu? --      Excl. in GC? --    No data found.  Updated Vital Signs BP 117/86   Pulse 77   Temp 98.1 F (36.7 C) (Oral)   Resp 16   Wt 155 lb (70.3 kg)   SpO2 98%   BMI 26.61 kg/m   Visual Acuity Right Eye Distance:   Left Eye Distance:   Bilateral Distance:    Right Eye Near:   Left Eye Near:    Bilateral Near:     Physical Exam Vitals and nursing note reviewed.  Constitutional:      General: She is not in acute distress.    Appearance: She is well-developed.  HENT:     Head: Normocephalic and atraumatic.     Right Ear: Tympanic membrane normal.     Left Ear: Tympanic membrane normal.     Nose: Congestion and rhinorrhea present.     Mouth/Throat:     Mouth: Mucous membranes are moist.     Pharynx: Oropharynx is clear.  Eyes:     Conjunctiva/sclera: Conjunctivae normal.  Cardiovascular:     Rate and Rhythm: Normal rate and regular rhythm.     Heart sounds: Normal heart sounds.  Pulmonary:     Effort: Pulmonary effort is normal. No respiratory distress.     Breath sounds: Normal breath sounds.  Abdominal:     Palpations: Abdomen is soft.      Tenderness: There is no abdominal tenderness.  Musculoskeletal:     Cervical back: Neck supple.  Skin:    General: Skin is warm and dry.  Neurological:     General: No focal deficit present.     Mental Status: She is alert and oriented to person, place, and time.  Psychiatric:        Mood and Affect: Mood normal.        Behavior: Behavior normal.      UC Treatments / Results  Labs (all labs ordered are listed, but only abnormal results are displayed) Labs Reviewed - No data to display  EKG   Radiology No results found.  Procedures Procedures (including critical care time)  Medications Ordered in UC Medications - No data to display  Initial Impression / Assessment and Plan / UC Course  I have reviewed the triage vital signs and the nursing notes.  Pertinent labs & imaging results that were available during my care of the patient were reviewed by me and considered in my medical decision making (see chart for details).   Viral illness.  Influenza and COVID pending.  Instructed patient to self quarantine until the test results are back.  Discussed symptomatic treatment including Tylenol or ibuprofen, rest, hydration.  Instructed patient to follow up with PCP if her symptoms are not improving.  Patient agrees to plan of care.    Final Clinical Impressions(s) / UC Diagnoses   Final diagnoses:  Viral illness     Discharge Instructions     Your COVID and Influenza tests are pending.  You should self quarantine until the test results are back.    Take Tylenol or ibuprofen as needed for fever or discomfort.  Rest and keep yourself hydrated.    Follow-up with your primary care provider if your symptoms are not improving.        ED Prescriptions    None     PDMP not reviewed this encounter.   Mickie Bail, NP 04/17/21 1250

## 2021-04-18 LAB — COVID-19, FLU A+B NAA
Influenza A, NAA: DETECTED — AB
Influenza B, NAA: NOT DETECTED
SARS-CoV-2, NAA: NOT DETECTED

## 2021-05-14 ENCOUNTER — Other Ambulatory Visit: Payer: Self-pay | Admitting: Obstetrics and Gynecology

## 2021-06-17 ENCOUNTER — Other Ambulatory Visit: Payer: Self-pay | Admitting: Obstetrics and Gynecology

## 2021-06-17 MED ORDER — DROSPIRENONE-ETHINYL ESTRADIOL 3-0.02 MG PO TABS: 1.0000 | ORAL_TABLET | Freq: Every day | ORAL | 0 refills | Status: DC

## 2021-07-28 ENCOUNTER — Ambulatory Visit: Payer: BC Managed Care – PPO | Admitting: Obstetrics and Gynecology

## 2021-08-24 ENCOUNTER — Ambulatory Visit: Payer: BC Managed Care – PPO | Admitting: Advanced Practice Midwife

## 2021-09-06 ENCOUNTER — Other Ambulatory Visit: Payer: Self-pay | Admitting: Obstetrics and Gynecology
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
## 2022-02-17 ENCOUNTER — Encounter: Payer: Self-pay | Admitting: Obstetrics and Gynecology

## 2022-02-17 ENCOUNTER — Other Ambulatory Visit (HOSPITAL_COMMUNITY)
Admission: RE | Admit: 2022-02-17 | Discharge: 2022-02-17 | Disposition: A | Discharge status: Home / Self Care | Payer: BC Managed Care – PPO | Source: Ambulatory Visit | Attending: Obstetrics and Gynecology | Admitting: Obstetrics and Gynecology

## 2022-02-17 ENCOUNTER — Ambulatory Visit (INDEPENDENT_AMBULATORY_CARE_PROVIDER_SITE_OTHER): Payer: BC Managed Care – PPO | Admitting: Obstetrics and Gynecology

## 2022-02-17 ENCOUNTER — Other Ambulatory Visit: Payer: Self-pay

## 2022-02-17 VITALS — BP 120/90 | Ht 64.0 in | Wt 159.0 lb

## 2022-02-17 DIAGNOSIS — Z124 Encounter for screening for malignant neoplasm of cervix: Secondary | ICD-10-CM | POA: Insufficient documentation

## 2022-02-17 DIAGNOSIS — Z1151 Encounter for screening for human papillomavirus (HPV): Secondary | ICD-10-CM

## 2022-02-17 DIAGNOSIS — Z30011 Encounter for initial prescription of contraceptive pills: Secondary | ICD-10-CM | POA: Diagnosis not present

## 2022-02-17 DIAGNOSIS — Z01419 Encounter for gynecological examination (general) (routine) without abnormal findings: Secondary | ICD-10-CM

## 2022-02-17 DIAGNOSIS — N6332 Unspecified lump in axillary tail of the left breast: Secondary | ICD-10-CM

## 2022-02-17 MED ORDER — DROSPIRENONE-ETHINYL ESTRADIOL 3-0.02 MG PO TABS: 1.0000 | ORAL_TABLET | Freq: Every day | ORAL | 3 refills | Status: DC

## 2022-02-17 NOTE — Patient Instructions (Signed)
I value your feedback and you entrusting us with your care. If you get a [facility] patient survey, I would appreciate you taking the time to let us know about your experience today. Thank you! ? ? ?

## 2022-02-17 NOTE — Progress Notes (Signed)
? ?PCP:  Patient, No Pcp Per (Inactive) ? ? ?Chief Complaint  ?Patient presents with  ? Gynecologic Exam  ? Contraception  ?  Interested in OCPs  ? ? ? ?HPI: ?     [name] is a 31 y.o. [phone] who LMP was Patient's last menstrual period was 01/20/2022 (approximate)., presents today for her annual examination.  Her menses are monthly, lasting 4 days, occas BTB, mild dysmen.   ? ?Sex activity: single partner, contraception - condoms. Pt would like to restart OCPs. Did well on yaz last yr. No pain/bleeding.  ?Last Pap: 02/21/20 Results: no abnormalities ?Hx of STDs: chlamydia in distant past ? ?There is no FH of breast cancer. There is no FH of ovarian cancer. The patient does not do self-breast exams. Pt noticed a "ball" in LT breast/axillary tail about 6 months ago that was tender, then resolved. No sx since. Pt wonder if was a cyst.  ? ?Tobacco use: The patient denies current or previous tobacco use. ?Alcohol use: social drinker ?No drug use.  ?Exercise: moderately active ? ?She does get adequate calcium and Vitamin D in her diet. ? ? ?Past Medical History:  ?Diagnosis Date  ? BV (bacterial vaginosis)   ? ? ?Past Surgical History:  ?Procedure Laterality Date  ? CESAREAN SECTION  2015  ? WRIST SURGERY    ? ? ?Family History  ?Problem Relation Age of Onset  ? Other Father   ?     accident  ? ? ?Social History  ? ?Socioeconomic History  ? Marital status: Single  ?  Spouse name: Not on file  ? Number of children: Not on file  ? Years of education: Not on file  ? Highest education level: Not on file  ?Occupational History  ? Not on file  ?Tobacco Use  ? Smoking status: Never  ? Smokeless tobacco: Never  ?Vaping Use  ? Vaping Use: Never used  ?Substance and Sexual Activity  ? Alcohol use: Yes  ?  Comment: occ  ? Drug use: No  ? Sexual activity: Yes  ?  Birth control/protection: None, Condom  ?Other Topics Concern  ? Not on file  ?Social History Narrative  ? Not on file  ? ?Social Determinants of Health   ? ?Financial Resource Strain: Not on file  ?Food Insecurity: Not on file  ?Transportation Needs: Not on file  ?Physical Activity: Not on file  ?Stress: Not on file  ?Social Connections: Not on file  ?Intimate Partner Violence: Not on file  ? ? ? ?Current Outpatient Medications:  ?  drospirenone-ethinyl estradiol (YAZ) 3-0.02 MG tablet, Take 1 tablet by mouth daily., Disp: 84 tablet, Rfl: 3 ? ? ? ? ?ROS: ? ?Review of Systems  ?Constitutional:  Negative for fatigue, fever and unexpected weight change.  ?Respiratory:  Negative for cough, shortness of breath and wheezing.   ?Cardiovascular:  Negative for chest pain, palpitations and leg swelling.  ?Gastrointestinal:  Negative for blood in stool, constipation, diarrhea, nausea and vomiting.  ?Endocrine: Negative for cold intolerance, heat intolerance and polyuria.  ?Genitourinary:  Negative for dyspareunia, dysuria, flank pain, frequency, genital sores, hematuria, menstrual problem, pelvic pain, urgency, vaginal bleeding, vaginal discharge and vaginal pain.  ?Musculoskeletal:  Negative for back pain, joint swelling and myalgias.  ?Skin:  Negative for rash.  ?Neurological:  Negative for dizziness, syncope, light-headedness, numbness and headaches.  ?Hematological:  Negative for adenopathy.  ?Psychiatric/Behavioral:  Negative for agitation, confusion, sleep disturbance and suicidal ideas. The patient is  not nervous/anxious.  BREAST: No symptoms ? ? ?Objective: ?BP 120/90   Ht 5\' 4"  (1.626 m)   Wt 159 lb (72.1 kg)   LMP 01/20/2022 (Approximate)   BMI 27.29 kg/m?  ? ? ?Physical Exam ?Constitutional:   ?   Appearance: She is well-developed.  ?Genitourinary:  ?   Vulva normal.  ?   Right Labia: No rash, tenderness or lesions. ?   Left Labia: No tenderness, lesions or rash. ?   Vaginal bleeding present.  ?   No vaginal discharge, erythema or tenderness.  ? ?   Right Adnexa: not tender and no mass present. ?   Left Adnexa: not tender and no mass present. ?   No cervical  friability or polyp.  ?   Uterus is not enlarged or tender.  ?Breasts: ?   Right: No mass, nipple discharge, skin change or tenderness.  ?   Left: No mass, nipple discharge, skin change or tenderness.  ?Neck:  ?   Thyroid: No thyromegaly.  ?Cardiovascular:  ?   Rate and Rhythm: Normal rate and regular rhythm.  ?   Heart sounds: Normal heart sounds. No murmur heard. ?Pulmonary:  ?   Effort: Pulmonary effort is normal.  ?   Breath sounds: Normal breath sounds.  ?Abdominal:  ?   Palpations: Abdomen is soft.  ?   Tenderness: There is no abdominal tenderness. There is no guarding or rebound.  ?Musculoskeletal:     ?   General: Normal range of motion.  ?   Cervical back: Normal range of motion.  ?Lymphadenopathy:  ?   Cervical: No cervical adenopathy.  ?Neurological:  ?   General: No focal deficit present.  ?   Mental Status: She is alert and oriented to person, place, and time.  ?   Cranial Nerves: No cranial nerve deficit.  ?Skin: ?   General: Skin is warm and dry.  ?Psychiatric:     ?   Mood and Affect: Mood normal.     ?   Behavior: Behavior normal.     ?   Thought Content: Thought content normal.     ?   Judgment: Judgment normal.  ?Vitals reviewed.  ? ? ?Assessment/Plan: ?Encounter for annual routine gynecological examination ? ?Cervical cancer screening - Plan: Cytology - PAP ? ?Screening for HPV (human papillomavirus) - Plan: Cytology - PAP ? ?Encounter for initial prescription of contraceptive pills - Plan: drospirenone-ethinyl estradiol (YAZ) 3-0.02 MG tablet; OCP start Sun, condoms for 1 mo. Rx eRxd. F/u prn.  ? ?Mass of axillary tail of left breast--about 6 months ago, neg exam today. RTO prn sx.  ? ? ?Meds ordered this encounter  ?Medications  ? drospirenone-ethinyl estradiol (YAZ) 3-0.02 MG tablet  ?  Sig: Take 1 tablet by mouth daily.  ?  Dispense:  84 tablet  ?  Refill:  3  ?  Order Specific Question:   Supervising Provider  ?  Answer01-18-1998 Nadara Mustard  ? ?          ?GYN counsel adequate intake  of calcium and vitamin D, diet and exercise ? ? ?  F/U ? Return in about 1 year (around 02/18/2023). ? ?Jalyne Brodzinski B. Jaidon Ellery, PA-C ?02/17/2022 ?1:50 PM ?

## 2022-02-19 LAB — CYTOLOGY - PAP
Comment: NEGATIVE
Diagnosis: NEGATIVE
High risk HPV: POSITIVE — AB

## 2022-05-19 HISTORY — PX: LIPOSUCTION: SHX10

## 2023-01-23 ENCOUNTER — Other Ambulatory Visit: Payer: Self-pay | Admitting: Obstetrics and Gynecology

## 2023-01-23 DIAGNOSIS — Z30011 Encounter for initial prescription of contraceptive pills: Secondary | ICD-10-CM

## 2023-01-26 ENCOUNTER — Telehealth: Payer: Self-pay

## 2023-01-26 DIAGNOSIS — Z30011 Encounter for initial prescription of contraceptive pills: Secondary | ICD-10-CM

## 2023-01-26 MED ORDER — DROSPIRENONE-ETHINYL ESTRADIOL 3-0.02 MG PO TABS: 1.0000 | ORAL_TABLET | Freq: Every day | ORAL | 0 refills | Status: DC

## 2023-01-26 NOTE — Telephone Encounter (Signed)
[facility] VOICEMAIL: Patient reports she scheduled an annual for 03/01/23. She needs refill of yaz to get her to appt.

## 2023-01-26 NOTE — Telephone Encounter (Signed)
Rx RF sent, pt aware.

## 2023-02-28 ENCOUNTER — Encounter: Payer: Self-pay | Admitting: Obstetrics and Gynecology

## 2023-02-28 DIAGNOSIS — R87810 Cervical high risk human papillomavirus (HPV) DNA test positive: Secondary | ICD-10-CM | POA: Insufficient documentation

## 2023-02-28 NOTE — Progress Notes (Unsigned)
PCP:  Patient, No Pcp Per   No chief complaint on file.    HPI:      [name] is a 32 y.o. [phone] who LMP was No LMP recorded., presents today for her annual examination.  Her menses are monthly, lasting 4 days, occas BTB, mild dysmen.    Sex activity: single partner, contraception - condoms. Pt would like to restart OCPs. Did well on yaz last yr. No pain/bleeding.  Last Pap: 02/17/22 Results: no abnormalities/POS HPV DNA; repeat due in 1 yr Hx of STDs: chlamydia in distant past  There is no FH of breast cancer. There is no FH of ovarian cancer. The patient does not do self-breast exams. Pt noticed a "ball" in LT breast/axillary tail about 6 months ago that was tender, then resolved. No sx since. Pt wonder if was a cyst.   Tobacco use: The patient denies current or previous tobacco use. Alcohol use: social drinker No drug use.  Exercise: moderately active  She does get adequate calcium and Vitamin D in her diet.   Past Medical History:  Diagnosis Date   BV (bacterial vaginosis)     Past Surgical History:  Procedure Laterality Date   CESAREAN SECTION  2015   WRIST SURGERY      Family History  Problem Relation Age of Onset   Other Father        accident    Social History   Socioeconomic History   Marital status: Single    Spouse name: Not on file   Number of children: Not on file   Years of education: Not on file   Highest education level: Not on file  Occupational History   Not on file  Tobacco Use   Smoking status: Never   Smokeless tobacco: Never  Vaping Use   Vaping Use: Never used  Substance and Sexual Activity   Alcohol use: Yes    Comment: occ   Drug use: No   Sexual activity: Yes    Birth control/protection: None, Condom  Other Topics Concern   Not on file  Social History Narrative   Not on file   Social Determinants of Health   Financial Resource Strain: Not on file  Food Insecurity: Not on file  Transportation Needs: Not on  file  Physical Activity: Not on file  Stress: Not on file  Social Connections: Not on file  Intimate Partner Violence: Not on file     Current Outpatient Medications:    drospirenone-ethinyl estradiol (YAZ) 3-0.02 MG tablet, Take 1 tablet by mouth daily., Disp: 84 tablet, Rfl: 0     ROS:  Review of Systems  Constitutional:  Negative for fatigue, fever and unexpected weight change.  Respiratory:  Negative for cough, shortness of breath and wheezing.   Cardiovascular:  Negative for chest pain, palpitations and leg swelling.  Gastrointestinal:  Negative for blood in stool, constipation, diarrhea, nausea and vomiting.  Endocrine: Negative for cold intolerance, heat intolerance and polyuria.  Genitourinary:  Negative for dyspareunia, dysuria, flank pain, frequency, genital sores, hematuria, menstrual problem, pelvic pain, urgency, vaginal bleeding, vaginal discharge and vaginal pain.  Musculoskeletal:  Negative for back pain, joint swelling and myalgias.  Skin:  Negative for rash.  Neurological:  Negative for dizziness, syncope, light-headedness, numbness and headaches.  Hematological:  Negative for adenopathy.  Psychiatric/Behavioral:  Negative for agitation, confusion, sleep disturbance and suicidal ideas. The patient is not nervous/anxious.   BREAST: No symptoms   Objective: There were no  vitals taken for this visit.   Physical Exam Constitutional:      Appearance: She is well-developed.  Genitourinary:     Vulva normal.     Right Labia: No rash, tenderness or lesions.    Left Labia: No tenderness, lesions or rash.    Vaginal bleeding present.     No vaginal discharge, erythema or tenderness.      Right Adnexa: not tender and no mass present.    Left Adnexa: not tender and no mass present.    No cervical friability or polyp.     Uterus is not enlarged or tender.  Breasts:    Right: No mass, nipple discharge, skin change or tenderness.     Left: No mass, nipple  discharge, skin change or tenderness.  Neck:     Thyroid: No thyromegaly.  Cardiovascular:     Rate and Rhythm: Normal rate and regular rhythm.     Heart sounds: Normal heart sounds. No murmur heard. Pulmonary:     Effort: Pulmonary effort is normal.     Breath sounds: Normal breath sounds.  Abdominal:     Palpations: Abdomen is soft.     Tenderness: There is no abdominal tenderness. There is no guarding or rebound.  Musculoskeletal:        General: Normal range of motion.     Cervical back: Normal range of motion.  Lymphadenopathy:     Cervical: No cervical adenopathy.  Neurological:     General: No focal deficit present.     Mental Status: She is alert and oriented to person, place, and time.     Cranial Nerves: No cranial nerve deficit.  Skin:    General: Skin is warm and dry.  Psychiatric:        Mood and Affect: Mood normal.        Behavior: Behavior normal.        Thought Content: Thought content normal.        Judgment: Judgment normal.  Vitals reviewed.     Assessment/Plan: Encounter for annual routine gynecological examination  Cervical cancer screening - Plan: Cytology - PAP  Screening for HPV (human papillomavirus) - Plan: Cytology - PAP  Encounter for initial prescription of contraceptive pills - Plan: drospirenone-ethinyl estradiol (YAZ) 3-0.02 MG tablet; OCP start Sun, condoms for 1 mo. Rx eRxd. F/u prn.   Mass of axillary tail of left breast--about 6 months ago, neg exam today. RTO prn sx.    No orders of the defined types were placed in this encounter.            GYN counsel adequate intake of calcium and vitamin D, diet and exercise     F/U  No follow-ups on file.  Raygen Linquist B. Josecarlos Harriott, PA-C 02/28/2023 7:53 PM

## 2023-03-01 ENCOUNTER — Encounter: Payer: Self-pay | Admitting: Obstetrics and Gynecology

## 2023-03-01 ENCOUNTER — Ambulatory Visit (INDEPENDENT_AMBULATORY_CARE_PROVIDER_SITE_OTHER): Payer: BC Managed Care – PPO | Admitting: Obstetrics and Gynecology

## 2023-03-01 ENCOUNTER — Other Ambulatory Visit (HOSPITAL_COMMUNITY)
Admission: RE | Admit: 2023-03-01 | Discharge: 2023-03-01 | Disposition: A | Discharge status: Home / Self Care | Payer: BC Managed Care – PPO | Source: Ambulatory Visit | Attending: Obstetrics and Gynecology | Admitting: Obstetrics and Gynecology

## 2023-03-01 VITALS — BP 116/64 | Ht 64.0 in | Wt 148.0 lb

## 2023-03-01 DIAGNOSIS — R87810 Cervical high risk human papillomavirus (HPV) DNA test positive: Secondary | ICD-10-CM

## 2023-03-01 DIAGNOSIS — Z01419 Encounter for gynecological examination (general) (routine) without abnormal findings: Secondary | ICD-10-CM | POA: Diagnosis not present

## 2023-03-01 DIAGNOSIS — Z3041 Encounter for surveillance of contraceptive pills: Secondary | ICD-10-CM

## 2023-03-01 DIAGNOSIS — Z124 Encounter for screening for malignant neoplasm of cervix: Secondary | ICD-10-CM | POA: Diagnosis not present

## 2023-03-01 DIAGNOSIS — Z1151 Encounter for screening for human papillomavirus (HPV): Secondary | ICD-10-CM

## 2023-03-01 MED ORDER — DROSPIRENONE-ETHINYL ESTRADIOL 3-0.02 MG PO TABS: 1.0000 | ORAL_TABLET | Freq: Every day | ORAL | 3 refills | Status: DC

## 2023-03-01 NOTE — Patient Instructions (Signed)
I value your feedback and you entrusting us with your care. If you get a [facility] patient survey, I would appreciate you taking the time to let us know about your experience today. Thank you! ? ? ?

## 2023-03-04 LAB — CYTOLOGY - PAP
Comment: NEGATIVE
Diagnosis: NEGATIVE
High risk HPV: NEGATIVE

## 2023-06-13 ENCOUNTER — Ambulatory Visit (INDEPENDENT_AMBULATORY_CARE_PROVIDER_SITE_OTHER): Payer: BC Managed Care – PPO

## 2023-06-13 VITALS — BP 108/70 | HR 79 | Ht 64.0 in | Wt 150.0 lb

## 2023-06-13 DIAGNOSIS — Z3200 Encounter for pregnancy test, result unknown: Secondary | ICD-10-CM

## 2023-06-13 DIAGNOSIS — Z3201 Encounter for pregnancy test, result positive: Secondary | ICD-10-CM | POA: Diagnosis not present

## 2023-06-13 DIAGNOSIS — Z3687 Encounter for antenatal screening for uncertain dates: Secondary | ICD-10-CM

## 2023-06-13 LAB — POCT URINE PREGNANCY: Preg Test, Ur: POSITIVE — AB

## 2023-06-13 NOTE — Progress Notes (Signed)
    NURSE VISIT NOTE  Subjective:    Patient ID: Vanessa Vang, female    DOB: 05/16/1991, 32 y.o.   MRN: [id number]  HPI  Patient is a 32 y.o. [id number] female who presents for evaluation of amenorrhea. She believes she could be pregnant. Current symptoms also include:  no appetite  . She has no recollection of Last period. Per front desk she has to be scheduled for [place] for dating, order is in.  Objective:    BP 108/70   Pulse 79   Ht 5\' 4"  (1.626 m)   Wt 150 lb (68 kg)   LMP 02/26/2023 (Exact Date)   BMI 25.75 kg/m   Lab Review  Results for orders placed or performed in visit on 06/13/23  POCT urine pregnancy  Result Value Ref Range   Preg Test, Ur Positive (A) Negative    Assessment:   1. Unsure of LMP (last menstrual period) as reason for ultrasound scan   2. Possible pregnancy     Plan:   She will schedule her nurse visit @ [redacted] wks pregnant, u/s for dating and labs @10  wk, and NOB visit at [redacted] wk pregnant.    Feel free to call with any questions.    Loney Laurence, CMA

## 2023-06-16 ENCOUNTER — Ambulatory Visit: Payer: BC Managed Care – PPO

## 2023-07-06 ENCOUNTER — Telehealth: Payer: Self-pay | Admitting: Obstetrics and Gynecology

## 2023-07-06 ENCOUNTER — Other Ambulatory Visit: Payer: BC Managed Care – PPO

## 2023-07-06 NOTE — Telephone Encounter (Signed)
Reached out to pt to reschedule dating scan that was scheduled on 07/06/2023 at 3:00.  Left message for pt to call back.  Pt will need to be given the number for Centralized Scheduling.

## 2023-07-07 ENCOUNTER — Encounter: Payer: Self-pay | Admitting: Obstetrics and Gynecology

## 2023-07-07 NOTE — Telephone Encounter (Signed)
Reached out to pt (2x) to reschedule dating scan that was scheduled on 07/06/2023 at 3:00.  Left message for pt o call back.  Pt will need to be given the number for Centralized Scheduling.  Will send a MyChart letter.

## 2023-08-24 LAB — PANORAMA PRENATAL TEST FULL PANEL:PANORAMA TEST PLUS 5 ADDITIONAL MICRODELETIONS: FETAL FRACTION: 12.4

## 2023-09-08 ENCOUNTER — Ambulatory Visit
Admission: EM | Admit: 2023-09-08 | Discharge: 2023-09-08 | Disposition: A | Discharge status: Home / Self Care | Payer: Medicaid Other | Attending: Emergency Medicine | Admitting: Emergency Medicine

## 2023-09-08 DIAGNOSIS — Z3A18 18 weeks gestation of pregnancy: Secondary | ICD-10-CM | POA: Insufficient documentation

## 2023-09-08 DIAGNOSIS — B9789 Other viral agents as the cause of diseases classified elsewhere: Secondary | ICD-10-CM | POA: Insufficient documentation

## 2023-09-08 DIAGNOSIS — Z1152 Encounter for screening for COVID-19: Secondary | ICD-10-CM | POA: Insufficient documentation

## 2023-09-08 DIAGNOSIS — J069 Acute upper respiratory infection, unspecified: Secondary | ICD-10-CM | POA: Insufficient documentation

## 2023-09-08 DIAGNOSIS — O99512 Diseases of the respiratory system complicating pregnancy, second trimester: Secondary | ICD-10-CM | POA: Diagnosis not present

## 2023-09-08 DIAGNOSIS — J029 Acute pharyngitis, unspecified: Secondary | ICD-10-CM | POA: Diagnosis present

## 2023-09-08 LAB — RESP PANEL BY RT-PCR (RSV, FLU A&B, COVID)  RVPGX2
Influenza A by PCR: NEGATIVE
Influenza B by PCR: NEGATIVE
Resp Syncytial Virus by PCR: NEGATIVE
SARS Coronavirus 2 by RT PCR: NEGATIVE

## 2023-09-08 LAB — POCT RAPID STREP A (OFFICE): Rapid Strep A Screen: NEGATIVE

## 2023-09-08 MED ORDER — NYSTATIN 100000 UNIT/ML MT SUSP: 5.0000 mL | Freq: Four times a day (QID) | OROMUCOSAL | 0 refills | Status: DC | PRN

## 2023-09-08 NOTE — Discharge Instructions (Signed)
Your symptoms today are most likely being caused by a virus and should steadily improve in time it can take up to 7 to 10 days before you truly start to see a turnaround however things will get better  Rapid strep test is negative for bacteria, swab has been sent to the to the lab to see if bacteria will grow, you will be notified if this occurs and antibiotic sent in  Respiratory panel checking for COVID flu and RSV is pending, you will be notified of positive test results only  May gargle and spit Magic mouthwash solution every 4-6 hours to temporary relief pain to the throat  Completed for paperwork you will find a list of medicines that are safe for you to use during pregnancy    You can take Tylenol as needed for fever reduction and pain relief.   For cough: honey 1/2 to 1 teaspoon (you can dilute the honey in water or another fluid).   You can use a humidifier for chest congestion and cough.  If you don't have a humidifier, you can sit in the bathroom with the hot shower running.      For sore throat: try warm salt water gargles, cepacol lozenges, throat spray, warm tea or water with lemon/honey, popsicles or ice,     It is important to stay hydrated: drink plenty of fluids (water, gatorade/powerade/pedialyte, juices, or teas) to keep your throat moisturized and help further relieve irritation/discomfort.

## 2023-09-08 NOTE — ED Provider Notes (Signed)
[name]    CSN: [id number] Arrival date & time: 09/08/23  1619      History   Chief Complaint Chief Complaint  Patient presents with   Sore Throat   Headache    HPI Vanessa Vang is a 32 y.o. female.   Patient presents for evaluation of subjective fever, chills, body aches, headache with sore throat beginning 2 days ago.  Associated nausea.  Has attempted use of Tylenol which has been ineffective.  History of migraines.  Currently [redacted] weeks pregnant.  No known sick contacts prior.  Past Medical History:  Diagnosis Date   BV (bacterial vaginosis)     Patient Active Problem List   Diagnosis Date Noted   Cervical high risk human papillomavirus (HPV) DNA test positive 02/28/2023   Bacterial vaginosis 02/21/2020    Past Surgical History:  Procedure Laterality Date   CESAREAN SECTION  2015   LIPOSUCTION  05/19/2022   WRIST SURGERY      OB History     Gravida  5   Para  1   Term  1   Preterm      AB  3   Living  1      SAB  1   IAB  2   Ectopic      Multiple      Live Births  1            Home Medications    Prior to Admission medications   Medication Sig Start Date End Date Taking? Authorizing Provider  magic mouthwash (nystatin, lidocaine, diphenhydrAMINE, alum & mag hydroxide) suspension Swish and swallow 5 mLs 4 (four) times daily as needed for mouth pain. 09/08/23  Yes Jessiah Steinhart R, NP  Prenatal Vit-Fe Fumarate-FA (MULTIVITAMIN-PRENATAL) 27-0.8 MG TABS tablet Take 1 tablet by mouth daily at 12 noon.   Yes [provider]  drospirenone-ethinyl estradiol (YAZ) 3-0.02 MG tablet Take 1 tablet by mouth daily. 03/01/23   Copland, Ilona Sorrel, PA-C    Family History Family History  Problem Relation Age of Onset   Other Father        accident    Social History Social History   Tobacco Use   Smoking status: Never   Smokeless tobacco: Never  Vaping Use   Vaping status: Never Used  Substance Use Topics    Alcohol use: Yes    Comment: occ   Drug use: No     Allergies   Patient has no known allergies.   Review of Systems Review of Systems   Physical Exam Triage Vital Signs ED Triage Vitals [09/08/23 1658]  Encounter Vitals Group     BP 111/70     Systolic BP Percentile      Diastolic BP Percentile      Pulse Rate 77     Resp 16     Temp 98.8 F (37.1 C)     Temp Source Oral     SpO2 98 %     Weight      Height      Head Circumference      Peak Flow      Pain Score 2     Pain Loc      Pain Education      Exclude from Growth Chart    No data found.  Updated Vital Signs BP 111/70 (BP Location: Left Arm)   Pulse 77   Temp 98.8 F (37.1 C) (Oral)   Resp 16  LMP 02/26/2023 (Exact Date)   SpO2 98%   Visual Acuity Right Eye Distance:   Left Eye Distance:   Bilateral Distance:    Right Eye Near:   Left Eye Near:    Bilateral Near:     Physical Exam Constitutional:      Appearance: Normal appearance. She is well-developed.  HENT:     Head: Normocephalic.     Right Ear: Tympanic membrane, ear canal and external ear normal.     Left Ear: Tympanic membrane, ear canal and external ear normal.     Nose: No congestion or rhinorrhea.     Mouth/Throat:     Pharynx: Posterior oropharyngeal erythema present. No oropharyngeal exudate.  Eyes:     Extraocular Movements: Extraocular movements intact.  Cardiovascular:     Rate and Rhythm: Normal rate and regular rhythm.     Pulses: Normal pulses.     Heart sounds: Normal heart sounds.  Pulmonary:     Effort: Pulmonary effort is normal.     Breath sounds: Normal breath sounds.  Skin:    General: Skin is warm and dry.  Neurological:     Mental Status: She is alert and oriented to person, place, and time. Mental status is at baseline.      UC Treatments / Results  Labs (all labs ordered are listed, but only abnormal results are displayed) Labs Reviewed  POCT RAPID STREP A (OFFICE)    EKG   Radiology No  results found.  Procedures Procedures (including critical care time)  Medications Ordered in UC Medications - No data to display  Initial Impression / Assessment and Plan / UC Course  I have reviewed the triage vital signs and the nursing notes.  Pertinent labs & imaging results that were available during my care of the patient were reviewed by me and considered in my medical decision making (see chart for details).  Viral URI  Patient is in no signs of distress nor toxic appearing.  Vital signs are stable.  Low suspicion for pneumonia, pneumothorax or bronchitis and therefore will defer imaging.  Rapid strep test negative, sent for culture, respiratory panel pending.  Prescribed Magic mouthwash.May use additional over-the-counter medications as needed for supportive care.  May follow-up with urgent care as needed if symptoms persist or worsen.   Final Clinical Impressions(s) / UC Diagnoses   Final diagnoses:  Viral URI     Discharge Instructions      Your symptoms today are most likely being caused by a virus and should steadily improve in time it can take up to 7 to 10 days before you truly start to see a turnaround however things will get better  Rapid strep test is negative for bacteria, swab has been sent to the to the lab to see if bacteria will grow, you will be notified if this occurs and antibiotic sent in  Respiratory panel checking for COVID flu and RSV is pending, you will be notified of positive test results only  May gargle and spit Magic mouthwash solution every 4-6 hours to temporary relief pain to the throat  Completed for paperwork you will find a list of medicines that are safe for you to use during pregnancy    You can take Tylenol as needed for fever reduction and pain relief.   For cough: honey 1/2 to 1 teaspoon (you can dilute the honey in water or another fluid).   You can use a humidifier for chest congestion and cough.  If  you don't have a humidifier,  you can sit in the bathroom with the hot shower running.      For sore throat: try warm salt water gargles, cepacol lozenges, throat spray, warm tea or water with lemon/honey, popsicles or ice,     It is important to stay hydrated: drink plenty of fluids (water, gatorade/powerade/pedialyte, juices, or teas) to keep your throat moisturized and help further relieve irritation/discomfort.    ED Prescriptions     Medication Sig Dispense Auth. Provider   magic mouthwash (nystatin, lidocaine, diphenhydrAMINE, alum & mag hydroxide) suspension Swish and swallow 5 mLs 4 (four) times daily as needed for mouth pain. 180 mL Valinda Hoar, NP      PDMP not reviewed this encounter.   Valinda Hoar, NP 09/08/23 1757

## 2023-09-08 NOTE — ED Triage Notes (Signed)
Pt presents with headache, nausea, fatigue, sore throat, chills that started 2 days ago. Taking tylenol.   Pt is currently [redacted] weeks pregnant.

## 2023-09-11 LAB — CULTURE, GROUP A STREP (THRC)

## 2024-11-10 ENCOUNTER — Ambulatory Visit
Admission: EM | Admit: 2024-11-10 | Discharge: 2024-11-10 | Disposition: A | Discharge status: Home / Self Care | Attending: Student | Admitting: Student

## 2024-11-10 ENCOUNTER — Encounter: Payer: Self-pay | Admitting: Emergency Medicine

## 2024-11-10 DIAGNOSIS — J069 Acute upper respiratory infection, unspecified: Secondary | ICD-10-CM

## 2024-11-10 MED ORDER — AZELASTINE HCL 0.1 % NA SOLN: 2.0000 | Freq: Two times a day (BID) | NASAL | 2 refills | Status: AC

## 2024-11-10 MED ORDER — PSEUDOEPHEDRINE HCL 30 MG PO TABS: 30.0000 mg | ORAL_TABLET | ORAL | 0 refills | Status: AC | PRN

## 2024-11-10 MED ORDER — PROMETHAZINE-DM 6.25-15 MG/5ML PO SYRP: 5.0000 mL | ORAL_SOLUTION | Freq: Four times a day (QID) | ORAL | 0 refills | Status: AC | PRN

## 2024-11-10 NOTE — Discharge Instructions (Addendum)
-  You have a virus, like the common cold.  Viruses typically last 5 to 7 days.  After 7 days, your symptoms should be improving rather than worsening.  If your symptoms improve, and then worsen again, this is when we worry about a sinus infection or a lung infection, and you should return for additional care. -Promethazine  DM cough syrup for congestion/cough. This could make you drowsy, so take at night before bed. - Use the azelastine  nasal spray for nasal congestion, 2 sprays twice daily while symptoms persist. - Use the Sudafed, 1 pill up to every 4 hours for nasal congestion.  This medication will give you energy, so do not take right before bed. -Your cough should slowly get better instead of worse. If you develop a cough productive of dark or red sputum, new shortness of breath, new chest tightness, new fevers, etc - seek additional care.

## 2024-11-10 NOTE — ED Triage Notes (Signed)
 Patient reports cough x 5 days. Patient reports sore throat, body aches, headaches and chills that started last night. Rates sore throat 4/10, body aches 6/10 and head ache 8/10. Patient has been taking Tylenol  mucinex with mild relief.

## 2024-11-10 NOTE — ED Provider Notes (Signed)
 [facility]    CSN: [id number] Arrival date & time: 11/10/24  1510      History   Chief Complaint Chief Complaint  Patient presents with   Cough   Sore Throat   Generalized Body Aches   Headache   Chills    HPI Vanessa Vang is a 33 y.o. female presenting with cough, myalgias, and sore throat x5 days.  Patient reports cough x 5 days. Patient reports sore throat, body aches, headaches and chills that started last night. Cough is productive of yellow sputum. Rates sore throat 4/10, body aches 6/10 and head ache 8/10. Patient has been taking Tylenol  and mucinex with mild relief. Denies h/o pulm ds or asthma. Late acetaminophen  2-3 hours ago.   HPI  Past Medical History:  Diagnosis Date   BV (bacterial vaginosis)     Patient Active Problem List   Diagnosis Date Noted   Cervical high risk human papillomavirus (HPV) DNA test positive 02/28/2023   Bacterial vaginosis 02/21/2020    Past Surgical History:  Procedure Laterality Date   CESAREAN SECTION  2015   LIPOSUCTION  05/19/2022   WRIST SURGERY      OB History     Gravida  5   Para  1   Term  1   Preterm      AB  3   Living  1      SAB  1   IAB  2   Ectopic      Multiple      Live Births  1            Home Medications    Prior to Admission medications   Medication Sig Start Date End Date Taking? Authorizing Provider  azelastine  (ASTELIN ) 0.1 % nasal spray Place 2 sprays into both nostrils 2 (two) times daily. Use in each nostril as directed 11/10/24  Yes Arlyss Leita BRAVO, PA-C  promethazine -dextromethorphan (PROMETHAZINE -DM) 6.25-15 MG/5ML syrup Take 5 mLs by mouth 4 (four) times daily as needed for cough. 11/10/24  Yes Robert Sperl E, PA-C  pseudoephedrine  (SUDAFED) 30 MG tablet Take 1 tablet (30 mg total) by mouth every 4 (four) hours as needed for congestion. 11/10/24  Yes Arlyss Leita BRAVO, PA-C    Family History Family History  Problem Relation Age of Onset   Other Father         accident    Social History Social History   Tobacco Use   Smoking status: Never   Smokeless tobacco: Never  Vaping Use   Vaping status: Never Used  Substance Use Topics   Alcohol use: Yes    Comment: occ   Drug use: No     Allergies   Patient has no known allergies.   Review of Systems Review of Systems  Constitutional:  Positive for chills. Negative for appetite change and fever.  HENT:  Positive for congestion and sore throat. Negative for ear pain, rhinorrhea, sinus pressure and sinus pain.   Eyes:  Negative for redness and visual disturbance.  Respiratory:  Positive for cough. Negative for chest tightness, shortness of breath and wheezing.   Cardiovascular:  Negative for chest pain and palpitations.  Gastrointestinal:  Negative for abdominal pain, constipation, diarrhea, nausea and vomiting.  Genitourinary:  Negative for dysuria, frequency and urgency.  Musculoskeletal:  Positive for myalgias.  Neurological:  Positive for headaches. Negative for dizziness and weakness.  Psychiatric/Behavioral:  Negative for confusion.   All other systems reviewed and are negative.  Physical Exam Triage Vital Signs ED Triage Vitals  Encounter Vitals Group     BP      Girls Systolic BP Percentile      Girls Diastolic BP Percentile      Boys Systolic BP Percentile      Boys Diastolic BP Percentile      Pulse      Resp      Temp      Temp src      SpO2      Weight      Height      Head Circumference      Peak Flow      Pain Score      Pain Loc      Pain Education      Exclude from Growth Chart    No data found.  Updated Vital Signs BP 114/81 (BP Location: Right Arm)   Pulse 77   Temp 98.5 F (36.9 C) (Oral)   Resp 20   LMP 10/30/2024 (Approximate)   SpO2 98%   Visual Acuity Right Eye Distance:   Left Eye Distance:   Bilateral Distance:    Right Eye Near:   Left Eye Near:    Bilateral Near:     Physical Exam Vitals reviewed.  Constitutional:       General: She is not in acute distress.    Appearance: Normal appearance. She is not ill-appearing.  HENT:     Head: Normocephalic and atraumatic.     Right Ear: Tympanic membrane, ear canal and external ear normal. No tenderness. No middle ear effusion. There is no impacted cerumen. Tympanic membrane is not perforated, erythematous, retracted or bulging.     Left Ear: Tympanic membrane, ear canal and external ear normal. No tenderness.  No middle ear effusion. There is no impacted cerumen. Tympanic membrane is not perforated, erythematous, retracted or bulging.     Nose: Congestion present.     Right Sinus: No maxillary sinus tenderness or frontal sinus tenderness.     Left Sinus: No maxillary sinus tenderness or frontal sinus tenderness.     Mouth/Throat:     Mouth: Mucous membranes are moist.     Pharynx: Uvula midline. No oropharyngeal exudate or posterior oropharyngeal erythema.     Tonsils: No tonsillar exudate.     Comments: Posterior oropharyngeal cobblestoning Eyes:     Extraocular Movements: Extraocular movements intact.     Pupils: Pupils are equal, round, and reactive to light.  Cardiovascular:     Rate and Rhythm: Normal rate and regular rhythm.     Heart sounds: Normal heart sounds.  Pulmonary:     Effort: Pulmonary effort is normal.     Breath sounds: Normal breath sounds. No decreased breath sounds, wheezing, rhonchi or rales.  Abdominal:     Palpations: Abdomen is soft.     Tenderness: There is no abdominal tenderness. There is no guarding or rebound.  Lymphadenopathy:     Cervical: No cervical adenopathy.     Right cervical: No superficial, deep or posterior cervical adenopathy.    Left cervical: No superficial, deep or posterior cervical adenopathy.  Skin:    Comments: No rash   Neurological:     General: No focal deficit present.     Mental Status: She is alert and oriented to person, place, and time.  Psychiatric:        Mood and Affect: Mood normal.         Behavior: Behavior normal.  Thought Content: Thought content normal.        Judgment: Judgment normal.      UC Treatments / Results  Labs (all labs ordered are listed, but only abnormal results are displayed) Labs Reviewed - No data to display  EKG   Radiology No results found.  Procedures Procedures (including critical care time)  Medications Ordered in UC Medications - No data to display  Initial Impression / Assessment and Plan / UC Course  I have reviewed the triage vital signs and the nursing notes.  Pertinent labs & imaging results that were available during my care of the patient were reviewed by me and considered in my medical decision making (see chart for details).     Patient is a pleasant 33 y.o. female presenting with viral URI with cough. The patient is afebrile and nontachycardic.  Last acetaminophen  was 3 hours ago.  LMP 10/30/2024, states she is not pregnant or breast-feeding.  On exam, lungs are clear to auscultation, and she does not have a sinus infection.  No history of pulmonary disease.  Did not check a COVID or influenza test due to duration of symptoms.  Will manage symptomatically with Promethazine  DM, azelastine  nasal spray, Sudafed; and she can continue Mucinex and acetaminophen  as needed.  Return precautions as below.  Final Clinical Impressions(s) / UC Diagnoses   Final diagnoses:  Viral URI with cough     Discharge Instructions      -You have a virus, like the common cold.  Viruses typically last 5 to 7 days.  After 7 days, your symptoms should be improving rather than worsening.  If your symptoms improve, and then worsen again, this is when we worry about a sinus infection or a lung infection, and you should return for additional care. -Promethazine  DM cough syrup for congestion/cough. This could make you drowsy, so take at night before bed. - Use the azelastine  nasal spray for nasal congestion, 2 sprays twice daily while symptoms  persist. - Use the Sudafed, 1 pill up to every 4 hours for nasal congestion.  This medication will give you energy, so do not take right before bed. -Your cough should slowly get better instead of worse. If you develop a cough productive of dark or red sputum, new shortness of breath, new chest tightness, new fevers, etc - seek additional care.     ED Prescriptions     Medication Sig Dispense Auth. Provider   promethazine -dextromethorphan (PROMETHAZINE -DM) 6.25-15 MG/5ML syrup Take 5 mLs by mouth 4 (four) times daily as needed for cough. 118 mL Adonia Porada E, PA-C   pseudoephedrine  (SUDAFED) 30 MG tablet Take 1 tablet (30 mg total) by mouth every 4 (four) hours as needed for congestion. 30 tablet Sicilia Killough E, PA-C   azelastine  (ASTELIN ) 0.1 % nasal spray Place 2 sprays into both nostrils 2 (two) times daily. Use in each nostril as directed 30 mL Arlyss Leita BRAVO, PA-C      PDMP not reviewed this encounter.   Arlyss Leita BRAVO, PA-C 11/10/24 1559
